# Patient Record
Sex: Female | Born: 1950 | Race: White | Marital: Married | Smoking: Never smoker
Health system: Northeastern US, Academic
[De-identification: ages and names within clinical notes are randomized; demographics above are authoritative.]

## PROBLEM LIST (undated history)

## (undated) DIAGNOSIS — I10 Essential (primary) hypertension: Secondary | ICD-10-CM

## (undated) DIAGNOSIS — J45909 Unspecified asthma, uncomplicated: Secondary | ICD-10-CM

## (undated) DIAGNOSIS — E876 Hypokalemia: Secondary | ICD-10-CM

## (undated) DIAGNOSIS — E039 Hypothyroidism, unspecified: Secondary | ICD-10-CM

## (undated) DIAGNOSIS — E7800 Pure hypercholesterolemia, unspecified: Secondary | ICD-10-CM

## (undated) DIAGNOSIS — J449 Chronic obstructive pulmonary disease, unspecified: Secondary | ICD-10-CM

## (undated) HISTORY — DX: Hypothyroidism, unspecified: E03.9

## (undated) HISTORY — DX: Unspecified asthma, uncomplicated: J45.909

## (undated) HISTORY — DX: Pure hypercholesterolemia, unspecified: E78.00

## (undated) HISTORY — DX: Essential (primary) hypertension: I10

## (undated) HISTORY — PX: TUBAL LIGATION: SHX77

## (undated) HISTORY — DX: Chronic obstructive pulmonary disease, unspecified: J44.9

## (undated) HISTORY — PX: TONSILLECTOMY: SUR1361

---

## 1998-01-21 ENCOUNTER — Encounter: Payer: Self-pay | Admitting: Obstetrics and Gynecology

## 1998-01-21 ENCOUNTER — Ambulatory Visit (HOSPITAL_COMMUNITY): Admission: RE | Admit: 1998-01-21 | Discharge: 1998-01-21 | Payer: Self-pay | Admitting: Obstetrics and Gynecology

## 1998-08-21 ENCOUNTER — Emergency Department (HOSPITAL_COMMUNITY): Admission: EM | Admit: 1998-08-21 | Discharge: 1998-08-21 | Payer: Self-pay | Admitting: Emergency Medicine

## 1998-08-22 ENCOUNTER — Encounter: Payer: Self-pay | Admitting: Emergency Medicine

## 1999-06-17 ENCOUNTER — Other Ambulatory Visit: Admission: RE | Admit: 1999-06-17 | Discharge: 1999-06-17 | Payer: Self-pay | Admitting: Emergency Medicine

## 1999-06-27 ENCOUNTER — Ambulatory Visit (HOSPITAL_COMMUNITY): Admission: RE | Admit: 1999-06-27 | Discharge: 1999-06-27 | Payer: Self-pay | Admitting: Emergency Medicine

## 1999-06-27 ENCOUNTER — Encounter: Payer: Self-pay | Admitting: Emergency Medicine

## 1999-08-14 ENCOUNTER — Emergency Department (HOSPITAL_COMMUNITY): Admission: EM | Admit: 1999-08-14 | Discharge: 1999-08-14 | Payer: Self-pay | Admitting: Emergency Medicine

## 1999-08-14 ENCOUNTER — Encounter: Payer: Self-pay | Admitting: Emergency Medicine

## 1999-09-02 ENCOUNTER — Other Ambulatory Visit: Admission: RE | Admit: 1999-09-02 | Discharge: 1999-09-02 | Payer: Self-pay | Admitting: *Deleted

## 1999-10-27 ENCOUNTER — Emergency Department (HOSPITAL_COMMUNITY): Admission: EM | Admit: 1999-10-27 | Discharge: 1999-10-27 | Payer: Self-pay | Admitting: Emergency Medicine

## 1999-11-04 ENCOUNTER — Other Ambulatory Visit: Admission: RE | Admit: 1999-11-04 | Discharge: 1999-11-04 | Payer: Self-pay | Admitting: *Deleted

## 1999-11-05 ENCOUNTER — Other Ambulatory Visit: Admission: RE | Admit: 1999-11-05 | Discharge: 1999-11-05 | Payer: Self-pay | Admitting: *Deleted

## 1999-11-05 ENCOUNTER — Encounter (INDEPENDENT_AMBULATORY_CARE_PROVIDER_SITE_OTHER): Payer: Self-pay | Admitting: Specialist

## 2000-02-12 ENCOUNTER — Other Ambulatory Visit: Admission: RE | Admit: 2000-02-12 | Discharge: 2000-02-12 | Payer: Self-pay | Admitting: *Deleted

## 2000-05-05 ENCOUNTER — Encounter: Admission: RE | Admit: 2000-05-05 | Discharge: 2000-05-05 | Payer: Self-pay | Admitting: *Deleted

## 2000-05-05 ENCOUNTER — Encounter: Payer: Self-pay | Admitting: *Deleted

## 2000-06-30 ENCOUNTER — Other Ambulatory Visit: Admission: RE | Admit: 2000-06-30 | Discharge: 2000-06-30 | Payer: Self-pay | Admitting: *Deleted

## 2000-10-06 ENCOUNTER — Encounter: Admission: RE | Admit: 2000-10-06 | Discharge: 2000-10-06 | Payer: Self-pay | Admitting: Otolaryngology

## 2000-10-06 ENCOUNTER — Encounter: Payer: Self-pay | Admitting: Otolaryngology

## 2000-12-26 ENCOUNTER — Encounter: Payer: Self-pay | Admitting: Emergency Medicine

## 2000-12-26 ENCOUNTER — Emergency Department (HOSPITAL_COMMUNITY): Admission: EM | Admit: 2000-12-26 | Discharge: 2000-12-26 | Payer: Self-pay | Admitting: Emergency Medicine

## 2001-08-27 ENCOUNTER — Encounter: Payer: Self-pay | Admitting: Emergency Medicine

## 2001-08-27 ENCOUNTER — Emergency Department (HOSPITAL_COMMUNITY): Admission: EM | Admit: 2001-08-27 | Discharge: 2001-08-27 | Payer: Self-pay | Admitting: Emergency Medicine

## 2001-08-28 ENCOUNTER — Encounter: Admission: RE | Admit: 2001-08-28 | Discharge: 2001-08-28 | Payer: Self-pay

## 2001-09-26 ENCOUNTER — Other Ambulatory Visit: Admission: RE | Admit: 2001-09-26 | Discharge: 2001-09-26 | Payer: Self-pay | Admitting: Obstetrics and Gynecology

## 2001-10-10 ENCOUNTER — Encounter: Payer: Self-pay | Admitting: *Deleted

## 2001-10-10 ENCOUNTER — Ambulatory Visit (HOSPITAL_COMMUNITY): Admission: RE | Admit: 2001-10-10 | Discharge: 2001-10-10 | Payer: Self-pay | Admitting: *Deleted

## 2001-12-08 ENCOUNTER — Encounter (HOSPITAL_COMMUNITY): Admission: RE | Admit: 2001-12-08 | Discharge: 2002-03-08 | Payer: Self-pay | Admitting: Neurosurgery

## 2003-01-07 ENCOUNTER — Encounter: Payer: Self-pay | Admitting: Emergency Medicine

## 2003-01-07 ENCOUNTER — Emergency Department (HOSPITAL_COMMUNITY): Admission: EM | Admit: 2003-01-07 | Discharge: 2003-01-07 | Payer: Self-pay | Admitting: Emergency Medicine

## 2003-04-19 ENCOUNTER — Emergency Department (HOSPITAL_COMMUNITY): Admission: EM | Admit: 2003-04-19 | Discharge: 2003-04-19 | Payer: Self-pay | Admitting: Emergency Medicine

## 2003-05-08 ENCOUNTER — Ambulatory Visit (HOSPITAL_COMMUNITY): Admission: RE | Admit: 2003-05-08 | Discharge: 2003-05-08 | Payer: Self-pay

## 2004-04-25 ENCOUNTER — Ambulatory Visit: Payer: Self-pay | Admitting: Cardiology

## 2004-04-25 ENCOUNTER — Encounter: Payer: Self-pay | Admitting: Cardiology

## 2004-04-25 ENCOUNTER — Ambulatory Visit (HOSPITAL_COMMUNITY): Admission: RE | Admit: 2004-04-25 | Discharge: 2004-04-25 | Payer: Self-pay | Admitting: Family Medicine

## 2004-04-27 ENCOUNTER — Emergency Department (HOSPITAL_COMMUNITY): Admission: EM | Admit: 2004-04-27 | Discharge: 2004-04-27 | Payer: Self-pay | Admitting: Emergency Medicine

## 2004-05-23 ENCOUNTER — Ambulatory Visit (HOSPITAL_COMMUNITY): Admission: RE | Admit: 2004-05-23 | Discharge: 2004-05-23 | Payer: Self-pay | Admitting: Family Medicine

## 2006-11-09 ENCOUNTER — Ambulatory Visit: Payer: Self-pay | Admitting: Pulmonary Disease

## 2006-11-09 LAB — CONVERTED CEMR LAB
ALT: 27 units/L (ref 0–35)
Basophils Relative: 0.9 % (ref 0.0–1.0)
Bilirubin, Direct: 0.1 mg/dL (ref 0.0–0.3)
CO2: 30 meq/L (ref 19–32)
Creatinine, Ser: 0.6 mg/dL (ref 0.4–1.2)
Eosinophils Relative: 1.3 % (ref 0.0–5.0)
GFR calc Af Amer: 134 mL/min
Glucose, Bld: 93 mg/dL (ref 70–99)
HCT: 38.9 % (ref 36.0–46.0)
Hemoglobin: 13.9 g/dL (ref 12.0–15.0)
Lymphocytes Relative: 28.8 % (ref 12.0–46.0)
Monocytes Absolute: 0.5 10*3/uL (ref 0.2–0.7)
Neutro Abs: 4.9 10*3/uL (ref 1.4–7.7)
Potassium: 3.4 meq/L — ABNORMAL LOW (ref 3.5–5.1)
Total Bilirubin: 0.6 mg/dL (ref 0.3–1.2)
Total Protein: 6.9 g/dL (ref 6.0–8.3)
WBC: 7.9 10*3/uL (ref 4.5–10.5)

## 2006-11-10 ENCOUNTER — Encounter: Payer: Self-pay | Admitting: Pulmonary Disease

## 2006-11-10 LAB — CONVERTED CEMR LAB: IgE (Immunoglobulin E), Serum: 20 intl units/mL (ref 0.0–180.0)

## 2008-07-06 ENCOUNTER — Emergency Department (HOSPITAL_COMMUNITY): Admission: EM | Admit: 2008-07-06 | Discharge: 2008-07-06 | Payer: Self-pay | Admitting: Internal Medicine

## 2009-08-03 ENCOUNTER — Encounter (INDEPENDENT_AMBULATORY_CARE_PROVIDER_SITE_OTHER): Payer: Self-pay | Admitting: Internal Medicine

## 2009-08-03 ENCOUNTER — Observation Stay (HOSPITAL_COMMUNITY): Admission: EM | Admit: 2009-08-03 | Discharge: 2009-08-04 | Payer: Self-pay | Admitting: Emergency Medicine

## 2009-08-03 ENCOUNTER — Ambulatory Visit: Payer: Self-pay | Admitting: Cardiology

## 2010-06-17 LAB — D-DIMER, QUANTITATIVE: D-Dimer, Quant: 0.38 ug/mL-FEU (ref 0.00–0.48)

## 2010-06-17 LAB — URINALYSIS, ROUTINE W REFLEX MICROSCOPIC
Glucose, UA: NEGATIVE mg/dL
Ketones, ur: NEGATIVE mg/dL
Leukocytes, UA: NEGATIVE
Nitrite: NEGATIVE
pH: 6.5 (ref 5.0–8.0)

## 2010-06-17 LAB — BASIC METABOLIC PANEL
CO2: 27 mEq/L (ref 19–32)
Chloride: 102 mEq/L (ref 96–112)
GFR calc Af Amer: >60 mL/min (ref >60)
Glucose, Bld: 99 mg/dL (ref 70–99)
Sodium: 139 mEq/L (ref 135–145)

## 2010-06-17 LAB — CK TOTAL AND CKMB (NOT AT ARMC): Relative Index: INVALID (ref 0.0–2.5)

## 2010-06-17 LAB — CBC
HCT: 41.6 % (ref 36.0–46.0)
MCV: 90.2 fL (ref 78.0–100.0)
Platelets: 262 10*3/uL (ref 150–400)
Platelets: 279 10*3/uL (ref 150–400)
RDW: 13.5 % (ref 11.5–15.5)
WBC: 12.3 10*3/uL — ABNORMAL HIGH (ref 4.0–10.5)
WBC: 14.8 10*3/uL — ABNORMAL HIGH (ref 4.0–10.5)

## 2010-06-17 LAB — CULTURE, BLOOD (ROUTINE X 2)

## 2010-06-17 LAB — LIPID PANEL
Cholesterol: 221 mg/dL — ABNORMAL HIGH (ref 0–200)
HDL: 54 mg/dL (ref >39)
LDL Cholesterol: 145 mg/dL — ABNORMAL HIGH (ref 0–99)
Triglycerides: 112 mg/dL (ref <150)

## 2010-06-17 LAB — DIFFERENTIAL
Basophils Absolute: 0.1 10*3/uL (ref 0.0–0.1)
Basophils Relative: 1 % (ref 0–1)
Eosinophils Absolute: 0 10*3/uL (ref 0.0–0.7)
Eosinophils Relative: 0 % (ref 0–5)
Lymphs Abs: 2 10*3/uL (ref 0.7–4.0)
Neutrophils Relative %: 82 % — ABNORMAL HIGH (ref 43–77)

## 2010-06-17 LAB — URINE MICROSCOPIC-ADD ON

## 2010-06-17 LAB — CARDIAC PANEL(CRET KIN+CKTOT+MB+TROPI)
CK, MB: 1.8 ng/mL (ref 0.3–4.0)
Relative Index: INVALID (ref 0.0–2.5)
Troponin I: 0.03 ng/mL (ref 0.00–0.06)
Troponin I: 0.05 ng/mL (ref 0.00–0.06)

## 2010-06-17 LAB — POCT CARDIAC MARKERS: Myoglobin, poc: 42.5 ng/mL (ref 12–200)

## 2010-06-17 LAB — POCT I-STAT, CHEM 8
BUN: 19 mg/dL (ref 6–23)
Calcium, Ion: 1.13 mmol/L (ref 1.12–1.32)
Glucose, Bld: 153 mg/dL — ABNORMAL HIGH (ref 70–99)
TCO2: 28 mmol/L (ref 0–100)

## 2010-06-17 LAB — PHOSPHORUS: Phosphorus: 3.9 mg/dL (ref 2.3–4.6)

## 2010-06-17 LAB — TSH: TSH: 2.549 u[IU]/mL (ref 0.350–4.500)

## 2010-06-17 LAB — TROPONIN I: Troponin I: 0.03 ng/mL (ref 0.00–0.06)

## 2010-07-09 LAB — POCT I-STAT, CHEM 8
Chloride: 102 mEq/L (ref 96–112)
HCT: 43 % (ref 36.0–46.0)
Potassium: 3.4 mEq/L — ABNORMAL LOW (ref 3.5–5.1)

## 2010-07-09 LAB — POCT CARDIAC MARKERS
CKMB, poc: <1 ng/mL — ABNORMAL LOW (ref 1.0–8.0)
Myoglobin, poc: 52.3 ng/mL (ref 12–200)
Troponin i, poc: <0.05 ng/mL (ref 0.00–0.09)

## 2010-07-09 LAB — D-DIMER, QUANTITATIVE: D-Dimer, Quant: 0.38 ug/mL-FEU (ref 0.00–0.48)

## 2010-08-12 NOTE — Assessment & Plan Note (Signed)
[facility]                             PULMONARY OFFICE NOTE   Sabrina Collins, Sabrina Collins                      MRN:          [id number]  DATE:11/09/2006                            DOB:          Feb 10, 1951    I met Sabrina Collins today for evaluation of her dyspnea.   She carries a diagnosis of asthma.  She says that her symptoms started  approximately 4 years ago when she developed a bronchitis and pneumonia,  and has gotten progressively worse over the last 1 year.  She has had  several episodes in which she needed to have a course of prednisone, as  well as antibiotics.  She says that she frequently will get episodes of  wheezing associated with a cough, which is usually dry.  She denies  having hemoptysis.  She does have problems with sneezing, itchy, and  watery eyes.  She says that summer time seems to be the worst time of  the year for her.  She does have occasional post-nasal drip, as well as  a globus sensation.  She is not having problems with exposures to dogs  and cats.  She does not have any problems when she is around grass and  pollen.  She does have worsening of her symptoms with strong smells.  She does develop hives when she is exposed to penicillin.  She had  worked in a [employer] for 2 years approximately 20 years ago.  She  says that she had a course of prednisone and azithromycin about a week  or 2 ago, which had helped with her symptoms, and she currently uses  Advair 250/50 as well as Combivent on a regular basis.  She states she  had a chest x-ray approximately 6 months ago and PFTs approximately 1  year ago, but I do not have those available for my review at this time.  She says that she also does have quite a bit of difficulty with her  sleep.  Her daughter, who is present with her today, says that she  snores quite loudly, but then gets very quiet and abruptly will start  making a loud snoring noise again.  Sabrina Collins also  complains of  feeling sleepy during the day.  She says she has gained approximately 50  pounds over the last 2 years and, associated with this, has been  worsening in her breathing symptoms, as well as her sleep patterns.  She  has not had any recent travel history.  She denies any significant sick  exposures.   PAST MEDICAL HISTORY:  Significant for a DVT 21 years ago with one of  her pregnancies.  As far as she knows, there is no history of pulmonary  embolism.  She has hypertension, asthma, allergies, and vertigo.   PAST SURGICAL HISTORY:  Significant for a tubal ligation 21 years ago.   CURRENT MEDICATIONS:  1. Amlodipine 5 mg daily.  2. Benicar hydrochlorothiazide 40/12.5 once daily.  3. Prevacid 30 mg daily.  4. Combivent 3 to 4 puffs daily.  5. Advair 250/50 one puff b.i.d.  6. Meloxicam 50 mg daily.  7. Vitamin E.  8. ProAir HFA as needed.  9. Meclizine 25 mg as needed.  10.Tramadol 50 mg as needed.   SOCIAL HISTORY:  She is married.  There is no history of tobacco or  alcohol use.  She has no second hand exposure to tobacco.  She is  currently a housewife.   FAMILY HISTORY:  Significant for her daughter who has asthma and  allergies.  Her mother had lung cancer and her father had colon cancer.   REVIEW OF SYSTEMS:  She does complain of reflux, as well as vertigo.   PHYSICAL EXAM:  She is 5 feet 1 inches tall and 229 pounds.  Temperature  is 97.8, blood pressure 136/80, heart rate 78, oxygen saturation 90% on  room air.  HEENT:  Pupils are reactive.  There is no sinus tenderness.  There is  clear nasal discharge.  There is mild erythema of the posterior  oropharynx.  She has a Mallampati IV airway.  There is no  lymphadenopathy.  No thyromegaly.  HEART:  S1, S2.  CHEST:  There is no wheezing or rales.  ABDOMEN:  Obese, soft, and nontender.  EXTREMITIES:  No cyanosis, clubbing, or edema.  NEUROLOGIC:  No focal deficits were appreciated.   IMPRESSION:  1. Dyspnea  with symptoms suggestive of allergy, possibly with an      allergic component.  What I would like for her to do is undergo      RAST test and to check her IgE level.  I will also have her undergo      a repeat chest x-ray and try to obtain a copy of her pulmonary      function test from last year.  In the meantime, I will switch her      to Symbicort 160/4.5 two puffs b.i.d., and I have instructed her on      the use of inhalers and advised her to rinse her mouth out      afterwards.  She is also to continue on the use of her Prevacid and      I have instructed her on the use of nasal irrigation.  She is also      to continue on the use of her Combivent as needed.  I have advised      her that, if she is using Combivent, she does not need to use her      ProAir HFA.  Further recommendations will be made following the      review of the above test results.  2. Sleep disturbance with excessive daytime sleepiness in the setting      of hypertension.  I am concerned that she may have some degree of      sleep apnea.  I have discussed with her the importance of diet,      exercise, and weight loss.  Additionally, I will arrange for her to      undergo an overnight polysomnogram to further evaluate the      possibility of sleep-disordered breathing.   I will follow up with her in approximately 3 to 4 weeks.     Coralyn Helling, MD  Electronically Signed    VS/MedQ  DD: 11/10/2006  DT: 11/11/2006  Job #: [id number]   cc:   Tamera Punt, PA

## 2016-02-05 ENCOUNTER — Observation Stay (HOSPITAL_BASED_OUTPATIENT_CLINIC_OR_DEPARTMENT_OTHER): Payer: BLUE CROSS/BLUE SHIELD

## 2016-02-05 ENCOUNTER — Encounter (HOSPITAL_COMMUNITY): Payer: Self-pay | Admitting: Emergency Medicine

## 2016-02-05 ENCOUNTER — Observation Stay (HOSPITAL_COMMUNITY): Payer: BLUE CROSS/BLUE SHIELD

## 2016-02-05 ENCOUNTER — Observation Stay (HOSPITAL_COMMUNITY)
Admission: EM | Admit: 2016-02-05 | Discharge: 2016-02-06 | Disposition: A | Discharge status: Home / Self Care | Payer: BLUE CROSS/BLUE SHIELD | Attending: Internal Medicine | Admitting: Internal Medicine

## 2016-02-05 ENCOUNTER — Emergency Department (HOSPITAL_COMMUNITY): Payer: BLUE CROSS/BLUE SHIELD

## 2016-02-05 DIAGNOSIS — R079 Chest pain, unspecified: Secondary | ICD-10-CM

## 2016-02-05 DIAGNOSIS — E6609 Other obesity due to excess calories: Secondary | ICD-10-CM | POA: Diagnosis not present

## 2016-02-05 DIAGNOSIS — E669 Obesity, unspecified: Secondary | ICD-10-CM | POA: Diagnosis present

## 2016-02-05 DIAGNOSIS — Z79899 Other long term (current) drug therapy: Secondary | ICD-10-CM | POA: Diagnosis not present

## 2016-02-05 DIAGNOSIS — I10 Essential (primary) hypertension: Secondary | ICD-10-CM | POA: Diagnosis not present

## 2016-02-05 DIAGNOSIS — R52 Pain, unspecified: Secondary | ICD-10-CM

## 2016-02-05 DIAGNOSIS — R0789 Other chest pain: Principal | ICD-10-CM | POA: Insufficient documentation

## 2016-02-05 DIAGNOSIS — K219 Gastro-esophageal reflux disease without esophagitis: Secondary | ICD-10-CM

## 2016-02-05 HISTORY — DX: Essential (primary) hypertension: I10

## 2016-02-05 LAB — NM MYOCAR MULTI W/SPECT W/WALL MOTION / EF
Estimated workload: 1 METS
Exercise duration (min): 0 min
Exercise duration (sec): 0 s
LV dias vol: 55 mL (ref 46–106)
LV sys vol: 14 mL
MPHR: 158 {beats}/min
Peak HR: 104 {beats}/min
Percent HR: 65 %
RATE: 0
Rest HR: 71 {beats}/min
SDS: 0
SRS: 11
SSS: 11
TID: 1.67

## 2016-02-05 LAB — BASIC METABOLIC PANEL
Anion gap: 8 (ref 5–15)
BUN: 16 mg/dL (ref 6–20)
CHLORIDE: 105 mmol/L (ref 101–111)
CO2: 26 mmol/L (ref 22–32)
CREATININE: 0.89 mg/dL (ref 0.44–1.00)
Calcium: 9.5 mg/dL (ref 8.9–10.3)
GFR calc Af Amer: >60 mL/min (ref >60)
GFR calc non Af Amer: >60 mL/min (ref >60)
Glucose, Bld: 105 mg/dL — ABNORMAL HIGH (ref 65–99)
POTASSIUM: 4.1 mmol/L (ref 3.5–5.1)
SODIUM: 139 mmol/L (ref 135–145)

## 2016-02-05 LAB — CBC
HCT: 40.2 % (ref 36.0–46.0)
Hemoglobin: 13.5 g/dL (ref 12.0–15.0)
MCH: 30.9 pg (ref 26.0–34.0)
MCHC: 33.6 g/dL (ref 30.0–36.0)
MCV: 92 fL (ref 78.0–100.0)
Platelets: 271 K/uL (ref 150–400)
RBC: 4.37 MIL/uL (ref 3.87–5.11)
RDW: 13.3 % (ref 11.5–15.5)
WBC: 9.3 K/uL (ref 4.0–10.5)

## 2016-02-05 LAB — TROPONIN I

## 2016-02-05 LAB — HEPATIC FUNCTION PANEL
ALK PHOS: 53 U/L (ref 38–126)
ALT: 18 U/L (ref 14–54)
AST: 25 U/L (ref 15–41)
Albumin: 4 g/dL (ref 3.5–5.0)
BILIRUBIN INDIRECT: 0.5 mg/dL (ref 0.3–0.9)
BILIRUBIN TOTAL: 0.8 mg/dL (ref 0.3–1.2)
Bilirubin, Direct: 0.3 mg/dL (ref 0.1–0.5)
Total Protein: 6.7 g/dL (ref 6.5–8.1)

## 2016-02-05 LAB — LIPASE, BLOOD: Lipase: 37 U/L (ref 11–51)

## 2016-02-05 LAB — I-STAT TROPONIN, ED: Troponin i, poc: 0 ng/mL (ref 0.00–0.08)

## 2016-02-05 MED ORDER — LOSARTAN POTASSIUM 50 MG PO TABS
50.0000 mg | ORAL_TABLET | Freq: Every day | ORAL | Status: DC
  Administered 2016-02-05 – 2016-02-06 (×2): 50 mg via ORAL
  Filled 2016-02-05 (×2): qty 1

## 2016-02-05 MED ORDER — ONDANSETRON HCL 4 MG/2ML IJ SOLN
4.0000 mg | Freq: Four times a day (QID) | INTRAMUSCULAR | Status: DC | PRN
  Administered 2016-02-05: 4 mg via INTRAVENOUS
  Filled 2016-02-05: qty 2

## 2016-02-05 MED ORDER — TECHNETIUM TC 99M TETROFOSMIN IV KIT
30.0000 | PACK | Freq: Once | INTRAVENOUS | Status: AC | PRN
  Administered 2016-02-05: 30 via INTRAVENOUS

## 2016-02-05 MED ORDER — ENOXAPARIN SODIUM 40 MG/0.4ML ~~LOC~~ SOLN
40.0000 mg | SUBCUTANEOUS | Status: DC
  Administered 2016-02-06: 40 mg via SUBCUTANEOUS
  Filled 2016-02-05: qty 0.4

## 2016-02-05 MED ORDER — GI COCKTAIL ~~LOC~~
30.0000 mL | Freq: Four times a day (QID) | ORAL | Status: DC | PRN
  Administered 2016-02-05: 30 mL via ORAL
  Filled 2016-02-05: qty 30

## 2016-02-05 MED ORDER — REGADENOSON 0.4 MG/5ML IV SOLN
INTRAVENOUS | Status: AC
  Administered 2016-02-05: 0.4 mg via INTRAVENOUS
  Filled 2016-02-05: qty 5

## 2016-02-05 MED ORDER — ACETAMINOPHEN 325 MG PO TABS
650.0000 mg | ORAL_TABLET | ORAL | Status: DC | PRN
  Administered 2016-02-05 (×3): 650 mg via ORAL
  Filled 2016-02-05 (×3): qty 2

## 2016-02-05 MED ORDER — NITROGLYCERIN 0.4 MG SL SUBL
0.4000 mg | SUBLINGUAL_TABLET | SUBLINGUAL | Status: DC | PRN
  Administered 2016-02-05 (×2): 0.4 mg via SUBLINGUAL
  Filled 2016-02-05: qty 1

## 2016-02-05 MED ORDER — FAMOTIDINE 20 MG PO TABS
20.0000 mg | ORAL_TABLET | Freq: Two times a day (BID) | ORAL | Status: DC
  Administered 2016-02-05 – 2016-02-06 (×3): 20 mg via ORAL
  Filled 2016-02-05 (×3): qty 1

## 2016-02-05 MED ORDER — FUROSEMIDE 40 MG PO TABS
40.0000 mg | ORAL_TABLET | Freq: Every day | ORAL | Status: DC
  Administered 2016-02-05 – 2016-02-06 (×2): 40 mg via ORAL
  Filled 2016-02-05 (×2): qty 1

## 2016-02-05 MED ORDER — MORPHINE SULFATE (PF) 2 MG/ML IV SOLN
2.0000 mg | Freq: Once | INTRAVENOUS | Status: AC
  Administered 2016-02-05: 2 mg via INTRAVENOUS
  Filled 2016-02-05: qty 1

## 2016-02-05 MED ORDER — SPIRONOLACTONE 25 MG PO TABS
25.0000 mg | ORAL_TABLET | Freq: Every day | ORAL | Status: DC
  Administered 2016-02-05 – 2016-02-06 (×2): 25 mg via ORAL
  Filled 2016-02-05 (×2): qty 1

## 2016-02-05 MED ORDER — SUCRALFATE 1 G PO TABS
1.0000 g | ORAL_TABLET | Freq: Three times a day (TID) | ORAL | Status: DC
  Administered 2016-02-05 – 2016-02-06 (×3): 1 g via ORAL
  Filled 2016-02-05 (×3): qty 1

## 2016-02-05 MED ORDER — ASPIRIN 81 MG PO CHEW
324.0000 mg | CHEWABLE_TABLET | Freq: Once | ORAL | Status: AC
  Administered 2016-02-05: 324 mg via ORAL
  Filled 2016-02-05: qty 4

## 2016-02-05 MED ORDER — REGADENOSON 0.4 MG/5ML IV SOLN
0.4000 mg | Freq: Once | INTRAVENOUS | Status: AC
  Administered 2016-02-05: 0.4 mg via INTRAVENOUS
  Filled 2016-02-05: qty 5

## 2016-02-05 MED ORDER — TECHNETIUM TC 99M TETROFOSMIN IV KIT
10.0000 | PACK | Freq: Once | INTRAVENOUS | Status: AC | PRN
  Administered 2016-02-05: 10 via INTRAVENOUS

## 2016-02-05 MED ORDER — DICLOFENAC SODIUM 75 MG PO TBEC
75.0000 mg | DELAYED_RELEASE_TABLET | Freq: Two times a day (BID) | ORAL | Status: DC
  Administered 2016-02-05 – 2016-02-06 (×3): 75 mg via ORAL
  Filled 2016-02-05 (×3): qty 1

## 2016-02-05 NOTE — Progress Notes (Signed)
Patient admitted after midnight, await stress test.  Will get RUQ U/s as well since patient NPO-- no LFT elevation but nausea and vomiting, unable to keep anything down.  Added pepcid  Eulogio Bear DO

## 2016-02-05 NOTE — ED Triage Notes (Signed)
Patient reports mid chest/epigastric pain onset yesterday with nausea , denies emesis or diaphoresis , pain radiating to mid back . Denies SOB or fever .

## 2016-02-05 NOTE — H&P (Signed)
History and Physical  Patient Name: Sabrina Collins     [id number]    DOB: Feb 02, 1951    DOA: 02/05/2016 PCP: Imelda Pillow, NP   Patient coming from: Home     Chief Complaint: Chest pain  HPI: Sabrina Collins is a 65 y.o. female with a past medical history significant for HTN and obesity who presents with chest pain.  The patient was in her usual state of health until several days ago, she spent too long in a hot tub with friends, and since then she has felt "off" and tired and has foregone her usual exercise routine (treadmill, etc.).    Today, around 11A, she noticed onset of vague substernal/epigastric discomfort, mild in intensity, constant, not worse with anything in particular.  This persisted until tonight around 9 PM, a few hours after supper (meatloaf, vegetables), she had worsening of her chest pain was severe, constant epigastric pain radiating to her back, worse with laying down, associated with nausea but no vomiting. This lasted for several hours until she got her husband up from bed and had him bring her to the hospital.  ED course: -Afebrile, heart rate 60s, respirations and pulse oximetry normal, blood pressure 140/80 -Initial ECG showed normal sinus rhythm, left axis and troponin was negative. -Na 139, K 4.1, Cr 0.89, WBC 9.3, Hgb 13.5 -She was given nitroglycerin, which improved her pain -She was given full aspirin and TRH were asked to evaluate for admission  The patient has a past medical history significant for HTN, obesity. She is no history of diabetes, no prior cardiac disease, no family history of premature cardiovascular disease, no smoking. She has had heartburn in the past, but no pancreatitis.  She had a nuclear perfusion study in 2011 that was normal, that was performed after she presented with chest discomfort and abnormal ECG to her PCP.     Review of Systems:  Review of Systems  Respiratory: Negative for shortness of breath.     Cardiovascular: Positive for chest pain.  Gastrointestinal: Positive for nausea.  All other systems reviewed and are negative.    Past Medical History:  Diagnosis Date  . Hypertension     History reviewed. No pertinent surgical history.  Social History: Patient lives with her husband.  Patient walks unassisted.  She does not smoker.    Allergies  Allergen Reactions  . Codeine Rash  . Penicillins Rash    Family history: family history includes Asthma in her daughter; Colon cancer in her father; Lung cancer in her mother; Ulcers in her father.  Prior to Admission medications   Medication Sig Start Date End Date Taking? Authorizing Provider  carvedilol (COREG) 6.25 MG tablet Take 6.25 mg by mouth 2 (two) times daily with a meal.   Yes Historical Provider, MD  diclofenac (VOLTAREN) 75 MG EC tablet Take 75 mg by mouth 2 (two) times daily.   Yes Historical Provider, MD  furosemide (LASIX) 40 MG tablet Take 40 mg by mouth daily.   Yes Historical Provider, MD  losartan (COZAAR) 50 MG tablet Take 50 mg by mouth daily.   Yes Historical Provider, MD  spironolactone (ALDACTONE) 25 MG tablet Take 25 mg by mouth daily.   Yes Historical Provider, MD  vitamin E 400 UNIT capsule Take 400 Units by mouth daily.   Yes Historical Provider, MD       Physical Exam: BP 145/72   Pulse (!) 58   Temp 98.3 F (36.8 C) (Oral)  Resp 16   SpO2 100%  General appearance: Well-developed, adult female, alert and in no acute distress.   Eyes: Anicteric, conjunctiva pink, lids and lashes normal.     ENT: No nasal deformity, discharge, or epistaxis.  OP moist without lesions.   Skin: Warm and dry.   Cardiac: RRR, nl S1-S2, no murmurs appreciated.  Capillary refill is brisk.  JVP normal.  No LE edema.  Radial and DP pulses 2+ and symmetric.  No carotid bruits. Respiratory: Normal respiratory rate and rhythm.  CTAB without rales or wheezes. GI: Abdomen soft without rigidity.  Mild epigastric TTP. No  ascites, distension.   MSK: No deformities or effusions.   Pain not reproduced with palpation of precordium.  No pain with arm movement. Neuro: Sensorium intact and responding to questions, attention normal.  Speech is fluent.  Moves all extremities equally and with normal coordination.    Psych: Behavior appropriate.  Affect normal.  No evidence of aural or visual hallucinations or delusions.       Labs on Admission:  The metabolic panel shows normal electrolytes and renal function. The complete blood count shows no anemia, leukocytosis, thrombocytopenia. The initial troponin is negative.  Radiological Exams on Admission: Personally reviewed chest x-ray shows no focal opacity: Dg Chest 2 View  Result Date: 02/05/2016 CLINICAL DATA:  Mid to lower chest pain since this evening EXAM: CHEST  2 VIEW COMPARISON:  08/03/2009 FINDINGS: The heart size and mediastinal contours are within normal limits. Both lungs are clear. The visualized skeletal structures are unremarkable. IMPRESSION: No active cardiopulmonary disease. Electronically Signed   By: Ashley Royalty M.D.   On: 02/05/2016 01:24    EKG: Independently reviewed. Rate 69, QTC 413, left axis, no ST or T-wave changes.    Assessment/Plan 1. Chest pain: This is new.  The patient has HEART score of 4. Her pain has typical and atypical features in that it is substernal, dull, and was relieved with nitroglycerin, but is not particularly exertional.  Other potential causes of chest pain (PE, dissection, pneumonia/effusion, pericarditis) are doubted.  We will rule out pancreatitis. We have been asked to admit the patient for observation and etiology consultation with Cardiology tomorrow.  -Serial troponins are ordered -Telemetry -Consult to cardiology, appreciate recommendations -Obtain LFTs, lipase    2. HTN:  -Continue spironolactone, furosemide, losartan -Hold carvedilol in case of stress testing  3. Arthritis:  -Continue  diclofenac             DVT prophylaxis: Lovenox Diet: NPO after 4am for anticipated stress testing Code Status: FULL  Family Communication: Husband at bedside  Disposition Plan: Anticipate overnight observation for arrhythmia on telemetry, serial troponins and subsequent risk stratification by Cardiology.  If testing negative, home after. Consults called: Cardiology, notified via Inbasket Admission status: Telemetry, OBS   Medical decision making: Patient seen at 4:10 AM on 02/05/2016.  The patient was discussed with Dr. Christy Gentles. What exists of the patient's chart was reviewed in depth.  Clinical condition: stable.      Edwin Dada [facility] Pager [phone]

## 2016-02-05 NOTE — Consult Note (Signed)
Reason for Consult:   Chest pain  Requesting Physician: Dr Eliseo Squires Primary Cardiologist Dr Domenic Polite (last seen 2011)  HPI:   65 y/o overweight Caucasian female, with a history of HTN, seen by cardiology in 2011 (negative nuclear stress then), admitted with SSCP and we are asked to consult. The pt describes mid sternal chest pain with associated nausea and radiation to her back (between her shoulders). She denies any radiation to her jaw or arms. Her symptoms started 24-48 hrs ago and became worse last 24 hours. Her Troponin are negative and her EKG shows no acute changes.   PMHx:  Past Medical History:  Diagnosis Date  . Hypertension     History reviewed. No pertinent surgical history.  SOCHx:  reports that she has never smoked. She has never used smokeless tobacco. She reports that she does not drink alcohol or use drugs.  FAMHx: Family History  Problem Relation Age of Onset  . Lung cancer Mother   . Colon cancer Father   . Ulcers Father   . Asthma Daughter     ALLERGIES: Allergies  Allergen Reactions  . Codeine Rash  . Penicillins Rash    ROS: Review of Systems: General: negative for chills, fever, night sweats or weight changes.  Cardiovascular: negative for chest pain, dyspnea on exertion, edema, orthopnea, palpitations, paroxysmal nocturnal dyspnea or shortness of breath HEENT: negative for any visual disturbances, blindness, glaucoma Dermatological: negative for rash Respiratory: negative for cough, hemoptysis, or wheezing Urologic: negative for hematuria or dysuria Abdominal: negative for nausea, vomiting, diarrhea, bright red blood per rectum, melena, or hematemesis Neurologic: negative for visual changes, syncope, or dizziness Musculoskeletal: negative for back pain, joint pain, or swelling Psych: cooperative and appropriate All other systems reviewed and are otherwise negative except as noted above.   HOME MEDICATIONS: Prior to Admission  medications   Medication Sig Start Date End Date Taking? Authorizing Provider  carvedilol (COREG) 6.25 MG tablet Take 6.25 mg by mouth 2 (two) times daily with a meal.   Yes Historical Provider, MD  diclofenac (VOLTAREN) 75 MG EC tablet Take 75 mg by mouth 2 (two) times daily.   Yes Historical Provider, MD  furosemide (LASIX) 40 MG tablet Take 40 mg by mouth daily.   Yes Historical Provider, MD  losartan (COZAAR) 50 MG tablet Take 50 mg by mouth daily.   Yes Historical Provider, MD  spironolactone (ALDACTONE) 25 MG tablet Take 25 mg by mouth daily.   Yes Historical Provider, MD  vitamin E 400 UNIT capsule Take 400 Units by mouth daily.   Yes Historical Provider, MD    HOSPITAL MEDICATIONS: I have reviewed the patient's current medications.  VITALS: Blood pressure 133/60, pulse 63, temperature 97.5 F (36.4 C), temperature source Oral, resp. rate 18, weight 191 lb 9.6 oz (86.9 kg), SpO2 98 %.  PHYSICAL EXAM: General appearance: alert, cooperative, no distress and moderately obese Neck: no carotid bruit and no JVD Lungs: clear to auscultation bilaterally Heart: regular rate and rhythm Abdomen: obese, no RUQ tenderness to deep palpation Extremities: extremities normal, atraumatic, no cyanosis or edema Pulses: 2+ and symmetric Skin: pale, cool, dry Neurologic: Grossly normal  LABS: Results for orders placed or performed during the hospital encounter of 02/05/16 (from the past 24 hour(s))  Basic metabolic panel     Status: Abnormal   Collection Time: 02/05/16 12:49 AM  Result Value Ref Range   Sodium 139 135 - 145 mmol/L   Potassium 4.1  3.5 - 5.1 mmol/L   Chloride 105 101 - 111 mmol/L   CO2 26 22 - 32 mmol/L   Glucose, Bld 105 (H) 65 - 99 mg/dL   BUN 16 6 - 20 mg/dL   Creatinine, Ser 0.89 0.44 - 1.00 mg/dL   Calcium 9.5 8.9 - 10.3 mg/dL   GFR calc non Af Amer >60 >60 mL/min   GFR calc Af Amer >60 >60 mL/min   Anion gap 8 5 - 15  CBC     Status: None   Collection Time: 02/05/16  12:49 AM  Result Value Ref Range   WBC 9.3 4.0 - 10.5 K/uL   RBC 4.37 3.87 - 5.11 MIL/uL   Hemoglobin 13.5 12.0 - 15.0 g/dL   HCT 40.2 36.0 - 46.0 %   MCV 92.0 78.0 - 100.0 fL   MCH 30.9 26.0 - 34.0 pg   MCHC 33.6 30.0 - 36.0 g/dL   RDW 13.3 11.5 - 15.5 %   Platelets 271 150 - 400 K/uL  I-stat troponin, ED     Status: None   Collection Time: 02/05/16 12:55 AM  Result Value Ref Range   Troponin i, poc 0.00 0.00 - 0.08 ng/mL   Comment 3          Troponin I-serum (0, 3, 6 hours)     Status: None   Collection Time: 02/05/16  7:03 AM  Result Value Ref Range   Troponin I <0.03 <0.03 ng/mL  Lipase, blood     Status: None   Collection Time: 02/05/16  7:03 AM  Result Value Ref Range   Lipase 37 11 - 51 U/L  Hepatic function panel     Status: None   Collection Time: 02/05/16  7:03 AM  Result Value Ref Range   Total Protein 6.7 6.5 - 8.1 g/dL   Albumin 4.0 3.5 - 5.0 g/dL   AST 25 15 - 41 U/L   ALT 18 14 - 54 U/L   Alkaline Phosphatase 53 38 - 126 U/L   Total Bilirubin 0.8 0.3 - 1.2 mg/dL   Bilirubin, Direct 0.3 0.1 - 0.5 mg/dL   Indirect Bilirubin 0.5 0.3 - 0.9 mg/dL  Troponin I-serum (0, 3, 6 hours)     Status: None   Collection Time: 02/05/16 10:23 AM  Result Value Ref Range   Troponin I <0.03 <0.03 ng/mL    EKG: NSR  IMAGING: Dg Chest 2 View  Result Date: 02/05/2016 CLINICAL DATA:  Mid to lower chest pain since this evening EXAM: CHEST  2 VIEW COMPARISON:  08/03/2009 FINDINGS: The heart size and mediastinal contours are within normal limits. Both lungs are clear. The visualized skeletal structures are unremarkable. IMPRESSION: No active cardiopulmonary disease. Electronically Signed   By: Ashley Royalty M.D.   On: 02/05/2016 01:24    IMPRESSION: Principal Problem:   Chest pain with moderate risk of acute coronary syndrome Active Problems:   Essential hypertension   Obesity   RECOMMENDATION: Will proceed with Lexiscan Myoview today- further recommendations pending.    Time Spent Directly with Patient: 40 minutes  Kerin Ransom, [place] beeper 02/05/2016, 11:33 AM   History and all data above reviewed.  Patient examined.  I agree with the findings as above.  The patient exam reveals COR:RRR  ,  Lungs: Clear  ,  Abd: Positive bowel sounds, no rebound no guarding, Ext No edema  .  All available labs, radiology testing, previous records reviewed. Agree with documented assessment and plan. Chest  pain:  Mostly atypical and without objective evidence of ischemia.  Lexiscan Myoview today.  No further work up if this is negative.   Discussed healthy lifestyle.    Jeneen Rinks Aiyah Scarpelli  3:23 PM  02/05/2016

## 2016-02-05 NOTE — Progress Notes (Signed)
   Letta Kocher presented for a lexiscan cardiolite today.  No immediate complications.  Stress imaging is pending at this time.  Murray Hodgkins, NP 02/05/2016, 1:35 PM

## 2016-02-05 NOTE — ED Notes (Signed)
Pt to xray

## 2016-02-05 NOTE — ED Notes (Signed)
Pt back from x-ray.

## 2016-02-05 NOTE — ED Provider Notes (Signed)
[facility] DEPT Provider Note   CSN: [id number] Arrival date & time: 02/05/16  0037  By signing my name below, I, [name], attest that this documentation has been prepared under the direction and in the presence of Ripley Fraise, MD . Electronically Signed: [name], Scribe. 02/05/2016. 2:00 AM.  History   Chief Complaint Chief Complaint  Patient presents with  . Chest Pain   The history is provided by the patient. No language interpreter was used.  Chest Pain   This is a new problem. The current episode started 3 to 5 hours ago. The problem occurs constantly. The problem has been gradually improving. The pain is present in the epigastric region. The pain is moderate. Quality: tight. The pain radiates to the lower back. Duration of episode(s) is 5 hours. Associated symptoms include abdominal pain (Mild), back pain, nausea and weakness (Generalized). Pertinent negatives include no diaphoresis, no numbness and no shortness of breath. She has tried nothing for the symptoms.  Her past medical history is significant for hypertension.  Pertinent negatives for past medical history include no MI and no strokes.  Pertinent negatives for family medical history include: no early MI.   HPI Comments: Sabrina Collins is a 65 y.o. female with a hx of HTN who presents to the Emergency Department complaining of constant moderate chest pain that started about 5 hours ago. She states she had a sudden onset of pain that is currently gradually improving. She describes the pain as a tight sensation radiating to her back. She reports associated symptoms of generalized weakness, nausea, and mild abdominal pain. She had no treatment PTA. She denies any hx of MI, hx of CVA, family hx of cardiac complications, LOC, extremity weakness, numbness, diaphoresis, SOB, or other complaints.   PCP: Dr. Uvaldo Rising    Past Medical History:  Diagnosis Date  . Hypertension     There are no active problems to  display for this patient.   History reviewed. No pertinent surgical history.  OB History    No data available       Home Medications    Prior to Admission medications   Medication Sig Start Date End Date Taking? Authorizing Provider  carvedilol (COREG) 6.25 MG tablet Take 6.25 mg by mouth 2 (two) times daily with a meal.   Yes Historical Provider, MD  diclofenac (VOLTAREN) 75 MG EC tablet Take 75 mg by mouth 2 (two) times daily.   Yes Historical Provider, MD  furosemide (LASIX) 40 MG tablet Take 40 mg by mouth daily.   Yes Historical Provider, MD  losartan (COZAAR) 50 MG tablet Take 50 mg by mouth daily.   Yes Historical Provider, MD  spironolactone (ALDACTONE) 25 MG tablet Take 25 mg by mouth daily.   Yes Historical Provider, MD  vitamin E 400 UNIT capsule Take 400 Units by mouth daily.   Yes Historical Provider, MD    Family History No family history on file.  Social History Social History  Substance Use Topics  . Smoking status: Never Smoker  . Smokeless tobacco: Never Used  . Alcohol use No     Allergies   Codeine and Penicillins   Review of Systems Review of Systems  Constitutional: Negative for diaphoresis.  Respiratory: Negative for shortness of breath.   Cardiovascular: Positive for chest pain.  Gastrointestinal: Positive for abdominal pain (Mild) and nausea.  Musculoskeletal: Positive for back pain.  Neurological: Positive for weakness (Generalized). Negative for syncope and numbness.  All other systems reviewed  and are negative.    Physical Exam Updated Vital Signs BP 115/80 (BP Location: Left Arm)   Pulse 89   Temp 98.3 F (36.8 C) (Oral)   Resp 18   SpO2 100%   Physical Exam  Nursing note and vitals reviewed.  CONSTITUTIONAL: Well developed/well nourished HEAD: Normocephalic/atraumatic EYES: EOMI/PERRL ENMT: Mucous membranes moist NECK: supple no meningeal signs SPINE/BACK:entire spine nontender CV: S1/S2 noted, no murmurs/rubs/gallops  noted LUNGS: Lungs are clear to auscultation bilaterally, no apparent distress ABDOMEN: soft, nontender, no rebound or guarding, bowel sounds noted throughout abdomen GU:no cva tenderness NEURO: Pt is awake/alert/appropriate, moves all extremitiesx4.  No facial droop.   EXTREMITIES: pulses normal/equal, full ROM, no calf TTP or edema. SKIN: warm, color normal PSYCH: no abnormalities of mood noted, alert and oriented to situation   ED Treatments / Results  DIAGNOSTIC STUDIES: Oxygen Saturation is 100% on RA, normal by my interpretation.    COORDINATION OF CARE: 2:05 AM- Pt advised of plan for treatment and pt agrees.  Labs (all labs ordered are listed, but only abnormal results are displayed) Labs Reviewed  BASIC METABOLIC PANEL - Abnormal; Notable for the following:       Result Value   Glucose, Bld 105 (*)    All other components within normal limits  CBC  I-STAT TROPOININ, ED    EKG  EKG Interpretation  Date/Time:  Wednesday February 05 2016 00:41:06 EST Ventricular Rate:  69 PR Interval:  186 QRS Duration: 80 QT Interval:  386 QTC Calculation: 413 R Axis:   -63 Text Interpretation:  Normal sinus rhythm Left axis deviation Possible Anterior infarct , age undetermined Abnormal ECG Interpretation limited secondary to artifact No significant change since last tracing Confirmed by Christy Gentles  MD, [place] ([id number]) on 02/05/2016 1:06:48 AM       Radiology Dg Chest 2 View  Result Date: 02/05/2016 CLINICAL DATA:  Mid to lower chest pain since this evening EXAM: CHEST  2 VIEW COMPARISON:  08/03/2009 FINDINGS: The heart size and mediastinal contours are within normal limits. Both lungs are clear. The visualized skeletal structures are unremarkable. IMPRESSION: No active cardiopulmonary disease. Electronically Signed   By: Ashley Royalty M.D.   On: 02/05/2016 01:24    Procedures Procedures (including critical care time)  Medications Ordered in ED Medications  nitroGLYCERIN  (NITROSTAT) SL tablet 0.4 mg (0.4 mg Sublingual Given 02/05/16 0324)  aspirin chewable tablet 324 mg (324 mg Oral Given 02/05/16 0210)     Initial Impression / Assessment and Plan / ED Course  I have reviewed the triage vital signs and the nursing notes.  Pertinent labs & imaging results that were available during my care of the patient were reviewed by me and considered in my medical decision making (see chart for details).  Clinical Course     Pt with chest tightness that improved with NTG She has no known CAD, but due to age/risk factors (HEART = 4) Will admit for further evaluation She is awake/alert, clinically appropriate at this time Low suspicion for PE/Dissection  D/w dr danford for admission at this time  Final Clinical Impressions(s) / ED Diagnoses   Final diagnoses:  Chest pain, rule out acute myocardial infarction    New Prescriptions New Prescriptions   No medications on file   I personally performed the services described in this documentation, which was scribed in my presence. The recorded information has been reviewed and is accurate.        Ripley Fraise, MD 02/05/16 [phone]

## 2016-02-05 NOTE — ED Notes (Signed)
EDP at bedside  

## 2016-02-06 DIAGNOSIS — I10 Essential (primary) hypertension: Secondary | ICD-10-CM | POA: Diagnosis not present

## 2016-02-06 DIAGNOSIS — E6609 Other obesity due to excess calories: Secondary | ICD-10-CM

## 2016-02-06 DIAGNOSIS — K219 Gastro-esophageal reflux disease without esophagitis: Secondary | ICD-10-CM | POA: Diagnosis not present

## 2016-02-06 DIAGNOSIS — R079 Chest pain, unspecified: Secondary | ICD-10-CM | POA: Diagnosis not present

## 2016-02-06 MED ORDER — FAMOTIDINE 20 MG PO TABS: 20.0000 mg | ORAL_TABLET | Freq: Two times a day (BID) | ORAL | 0 refills | Status: DC

## 2016-02-06 NOTE — Progress Notes (Signed)
Patient being discharged home per Dr. Eliseo Squires order. All discharge instructions given in written format to patient and her husband.

## 2016-02-06 NOTE — Progress Notes (Signed)
Patient Name: Sabrina Collins Date of Encounter: 02/06/2016  Primary Cardiologist: Dr [facility] Problem List     Principal Problem:   Chest pain with moderate risk of acute coronary syndrome Active Problems:   Essential hypertension   Obesity     Subjective   No further chest pain  Inpatient Medications    Scheduled Meds: . diclofenac  75 mg Oral BID  . enoxaparin (LOVENOX) injection  40 mg Subcutaneous Q24H  . famotidine  20 mg Oral BID  . furosemide  40 mg Oral Daily  . losartan  50 mg Oral Daily  . spironolactone  25 mg Oral Daily  . sucralfate  1 g Oral TID WC & HS   Continuous Infusions:  PRN Meds: acetaminophen, gi cocktail, ondansetron (ZOFRAN) IV   Vital Signs    Vitals:   02/05/16 1344 02/05/16 1609 02/05/16 1952 02/06/16 0429  BP:  (!) 148/68 137/70 131/73  Pulse: 98 72 87 80  Resp:  16    Temp:  98.1 F (36.7 C) 98.5 F (36.9 C) 98.4 F (36.9 C)  TempSrc:  Oral Oral Oral  SpO2:  100% 97% 96%  Weight:    197 lb 11.2 oz (89.7 kg)    Intake/Output Summary (Last 24 hours) at 02/06/16 0847 Last data filed at 02/05/16 1827  Gross per 24 hour  Intake              280 ml  Output                0 ml  Net              280 ml   Filed Weights   02/05/16 0600 02/06/16 0429  Weight: 191 lb 9.6 oz (86.9 kg) 197 lb 11.2 oz (89.7 kg)    Physical Exam    GEN: Overweight female, in no acute distress.  HEENT: Grossly normal.  Neck: Supple, no JVD Cardiac: RRR, no murmurs, rubs, or gallops. No clubbing, cyanosis, edema.  Radials/DP/PT 2+ and equal bilaterally.  Respiratory:  Respirations regular and unlabored, clear to auscultation bilaterally. MS: no deformity or atrophy. Skin: warm and dry, no rash. Neuro:  Strength and sensation are intact. Psych: AAOx3.  Normal affect.  Labs    CBC  Recent Labs  02/05/16 0049  WBC 9.3  HGB 13.5  HCT 40.2  MCV 92.0  PLT 99991111   Basic Metabolic Panel  Recent Labs  02/05/16 0049  NA 139  K  4.1  CL 105  CO2 26  GLUCOSE 105*  BUN 16  CREATININE 0.89  CALCIUM 9.5   Liver Function Tests  Recent Labs  02/05/16 0703  AST 25  ALT 18  ALKPHOS 53  BILITOT 0.8  PROT 6.7  ALBUMIN 4.0    Recent Labs  02/05/16 0703  LIPASE 37   Cardiac Enzymes  Recent Labs  02/05/16 0703 02/05/16 1023 02/05/16 1440  TROPONINI <0.03 <0.03 <0.03    Telemetry    NSR - Personally Reviewed  ECG    NSR - Personally Reviewed  Radiology    Dg Chest 2 View  Result Date: 02/05/2016 CLINICAL DATA:  Mid to lower chest pain since this evening EXAM: CHEST  2 VIEW COMPARISON:  08/03/2009 FINDINGS: The heart size and mediastinal contours are within normal limits. Both lungs are clear. The visualized skeletal structures are unremarkable. IMPRESSION: No active cardiopulmonary disease. Electronically Signed   By: Ashley Royalty M.D.   On: 02/05/2016 01:24  Nm Myocar Multi W/spect W/wall Motion / Ef  Result Date: 02/05/2016  There was no ST segment deviation noted during stress.  This is a low risk study.  The left ventricular ejection fraction is hyperdynamic (>65%).  Nuclear stress EF: 75%.  1. Normal LV systolic function and wall motion. 2. Low count rest study but stress study appears normal.  Suspect no ischemia or infarction, low risk study.   US Abdomen Limited Ruq  Result Date: 02/05/2016 CLINICAL DATA:  One day of right upper quadrant pain EXAM: US ABDOMEN LIMITED - RIGHT UPPER QUADRANT COMPARISON:  Abdominal ultrasound of May 13, 2007 FINDINGS: Gallbladder: The gallbladder is adequately distended. There are multiple echogenic mobile shadowing stones. There is no gallbladder wall thickening, pericholecystic fluid, or positive sonographic Murphy's sign. Common bile duct: Diameter: 8.5 mm which is dilated. No intraluminal stones or sludge are observed. Liver: The hepatic echotexture is heterogeneously increased. There is no focal mass or ductal dilation. The surface contour is  smooth. IMPRESSION: Gallstones without sonographic evidence of acute cholecystitis. Dilated common bile duct without intraluminal stones or sludge. The dilation may reflect recent passage of the stone however. No intrahepatic ductal dilation. Mildly increased hepatic echotexture compatible with fatty infiltrative change. Electronically Signed   By: David  Martinique M.D.   On: 02/05/2016 15:38    Cardiac Studies   See Myoview results above  Patient Profile     65 y/o overweight Caucasian female, with a history of HTN, seen by cardiology in 2011 (negative nuclear stress then), admitted with SSCP. Her Troponin are negative and her EKG shows no acute changes. Myoview 11/8 low risk.  Assessment & Plan    We can see again as needed. F/U with her PCP.   Angelena Form, PA-C  02/06/2016, 8:47 AM

## 2016-02-06 NOTE — Discharge Summary (Addendum)
Physician Discharge Summary  Sabrina Collins [id number] DOB: 02-20-1951 DOA: 02/05/2016  PCP: Imelda Pillow, NP  Admit date: 02/05/2016 Discharge date: 02/06/2016   Recommendations for Outpatient Follow-Up:   1. Consider outpatient EGD if symptoms not improved   Discharge Diagnosis:   Principal Problem:   Chest pain with moderate risk of acute coronary syndrome Active Problems:   Essential hypertension   Obesity   Discharge disposition:  Home:  Discharge Condition: Improved.  Diet recommendation: Low sodium, heart healthy.  Wound care: None.   History of Present Illness:   Sabrina Collins is a 65 y.o. female with a past medical history significant for HTN and obesity who presents with chest pain.  The patient was in her usual state of health until several days ago, she spent too long in a hot tub with friends, and since then she has felt "off" and tired and has foregone her usual exercise routine (treadmill, etc.).    Today, around 11A, she noticed onset of vague substernal/epigastric discomfort, mild in intensity, constant, not worse with anything in particular.  This persisted until tonight around 9 PM, a few hours after supper (meatloaf, vegetables), she had worsening of her chest pain was severe, constant epigastric pain radiating to her back, worse with laying down, associated with nausea but no vomiting. This lasted for several hours until she got her husband up from bed and had him bring her to the hospital.   Hospital Course by Problem:   Chest pain suspect noncardiac due to GERD -low risk stress test -pain improved with pepcid -outpatient EGD if worsens -RUQ U/S done--     Medical Consultants:    cards   Discharge Exam:   Vitals:   02/05/16 1952 02/06/16 0429  BP: 137/70 131/73  Pulse: 87 80  Resp:    Temp: 98.5 F (36.9 C) 98.4 F (36.9 C)   Vitals:   02/05/16 1344 02/05/16 1609 02/05/16 1952 02/06/16 0429  BP:  (!) 148/68  137/70 131/73  Pulse: 98 72 87 80  Resp:  16    Temp:  98.1 F (36.7 C) 98.5 F (36.9 C) 98.4 F (36.9 C)  TempSrc:  Oral Oral Oral  SpO2:  100% 97% 96%  Weight:    89.7 kg (197 lb 11.2 oz)    Gen:  NAD   The results of significant diagnostics from this hospitalization (including imaging, microbiology, ancillary and laboratory) are listed below for reference.     Procedures and Diagnostic Studies:   Dg Chest 2 View  Result Date: 02/05/2016 CLINICAL DATA:  Mid to lower chest pain since this evening EXAM: CHEST  2 VIEW COMPARISON:  08/03/2009 FINDINGS: The heart size and mediastinal contours are within normal limits. Both lungs are clear. The visualized skeletal structures are unremarkable. IMPRESSION: No active cardiopulmonary disease. Electronically Signed   By: Ashley Royalty M.D.   On: 02/05/2016 01:24   Nm Myocar Multi W/spect W/wall Motion / Ef  Result Date: 02/05/2016  There was no ST segment deviation noted during stress.  This is a low risk study.  The left ventricular ejection fraction is hyperdynamic (>65%).  Nuclear stress EF: 75%.  1. Normal LV systolic function and wall motion. 2. Low count rest study but stress study appears normal.  Suspect no ischemia or infarction, low risk study.   US Abdomen Limited Ruq  Result Date: 02/05/2016 CLINICAL DATA:  One day of right upper quadrant pain EXAM: US ABDOMEN LIMITED - RIGHT UPPER QUADRANT  COMPARISON:  Abdominal ultrasound of May 13, 2007 FINDINGS: Gallbladder: The gallbladder is adequately distended. There are multiple echogenic mobile shadowing stones. There is no gallbladder wall thickening, pericholecystic fluid, or positive sonographic Murphy's sign. Common bile duct: Diameter: 8.5 mm which is dilated. No intraluminal stones or sludge are observed. Liver: The hepatic echotexture is heterogeneously increased. There is no focal mass or ductal dilation. The surface contour is smooth. IMPRESSION: Gallstones without  sonographic evidence of acute cholecystitis. Dilated common bile duct without intraluminal stones or sludge. The dilation may reflect recent passage of the stone however. No intrahepatic ductal dilation. Mildly increased hepatic echotexture compatible with fatty infiltrative change. Electronically Signed   By: David  Martinique M.D.   On: 02/05/2016 15:38     Labs:   Basic Metabolic Panel:  Recent Labs Lab 02/05/16 0049  NA 139  K 4.1  CL 105  CO2 26  GLUCOSE 105*  BUN 16  CREATININE 0.89  CALCIUM 9.5   GFR CrCl cannot be calculated (Unknown ideal weight.). Liver Function Tests:  Recent Labs Lab 02/05/16 0703  AST 25  ALT 18  ALKPHOS 53  BILITOT 0.8  PROT 6.7  ALBUMIN 4.0    Recent Labs Lab 02/05/16 0703  LIPASE 37   No results for input(s): AMMONIA in the last 168 hours. Coagulation profile No results for input(s): INR, PROTIME in the last 168 hours.  CBC:  Recent Labs Lab 02/05/16 0049  WBC 9.3  HGB 13.5  HCT 40.2  MCV 92.0  PLT 271   Cardiac Enzymes:  Recent Labs Lab 02/05/16 0703 02/05/16 1023 02/05/16 1440  TROPONINI <0.03 <0.03 <0.03   BNP: Invalid input(s): POCBNP CBG: No results for input(s): GLUCAP in the last 168 hours. D-Dimer No results for input(s): DDIMER in the last 72 hours. Hgb A1c No results for input(s): HGBA1C in the last 72 hours. Lipid Profile No results for input(s): CHOL, HDL, LDLCALC, TRIG, CHOLHDL, LDLDIRECT in the last 72 hours. Thyroid function studies No results for input(s): TSH, T4TOTAL, T3FREE, THYROIDAB in the last 72 hours.  Invalid input(s): FREET3 Anemia work up No results for input(s): VITAMINB12, FOLATE, FERRITIN, TIBC, IRON, RETICCTPCT in the last 72 hours. Microbiology No results found for this or any previous visit (from the past 240 hour(s)).   Discharge Instructions:   Discharge Instructions    Diet - low sodium heart healthy    Complete by:  As directed    Discharge instructions     Complete by:  As directed    Outpatient EGD   Increase activity slowly    Complete by:  As directed        Medication List    TAKE these medications   carvedilol 6.25 MG tablet Commonly known as:  COREG Take 6.25 mg by mouth 2 (two) times daily with a meal.   diclofenac 75 MG EC tablet Commonly known as:  VOLTAREN Take 75 mg by mouth 2 (two) times daily.   famotidine 20 MG tablet Commonly known as:  PEPCID Take 1 tablet (20 mg total) by mouth 2 (two) times daily.   furosemide 40 MG tablet Commonly known as:  LASIX Take 40 mg by mouth daily.   losartan 50 MG tablet Commonly known as:  COZAAR Take 50 mg by mouth daily.   spironolactone 25 MG tablet Commonly known as:  ALDACTONE Take 25 mg by mouth daily.   vitamin E 400 UNIT capsule Take 400 Units by mouth daily.      Follow-up  Information    Millsaps, Luane School, NP Follow up in 1 week(s).   Why:  outpatient referral to GI for possible EGD if pepcid does not treat symptoms Contact information: [facility] [place] [address] [phone]            Time coordinating discharge: 35 min  Signed:  Mattelyn Imhoff Alison Stalling   [facility] 02/06/2016, 8:56 AM

## 2018-02-27 DIAGNOSIS — E876 Hypokalemia: Secondary | ICD-10-CM

## 2018-02-27 HISTORY — DX: Hypokalemia: E87.6

## 2018-03-08 ENCOUNTER — Other Ambulatory Visit: Payer: Self-pay

## 2018-03-08 ENCOUNTER — Encounter (HOSPITAL_COMMUNITY): Payer: Self-pay | Admitting: Emergency Medicine

## 2018-03-08 ENCOUNTER — Emergency Department (HOSPITAL_COMMUNITY): Payer: Medicare Other

## 2018-03-08 ENCOUNTER — Observation Stay (HOSPITAL_COMMUNITY)
Admission: EM | Admit: 2018-03-08 | Discharge: 2018-03-10 | Disposition: A | Discharge status: Home / Self Care | Payer: Medicare Other | Attending: Internal Medicine | Admitting: Internal Medicine

## 2018-03-08 DIAGNOSIS — E878 Other disorders of electrolyte and fluid balance, not elsewhere classified: Secondary | ICD-10-CM

## 2018-03-08 DIAGNOSIS — I10 Essential (primary) hypertension: Secondary | ICD-10-CM | POA: Diagnosis not present

## 2018-03-08 DIAGNOSIS — E860 Dehydration: Secondary | ICD-10-CM

## 2018-03-08 DIAGNOSIS — Z79899 Other long term (current) drug therapy: Secondary | ICD-10-CM | POA: Insufficient documentation

## 2018-03-08 DIAGNOSIS — D259 Leiomyoma of uterus, unspecified: Secondary | ICD-10-CM | POA: Insufficient documentation

## 2018-03-08 DIAGNOSIS — K5730 Diverticulosis of large intestine without perforation or abscess without bleeding: Secondary | ICD-10-CM | POA: Insufficient documentation

## 2018-03-08 DIAGNOSIS — K449 Diaphragmatic hernia without obstruction or gangrene: Secondary | ICD-10-CM | POA: Diagnosis not present

## 2018-03-08 DIAGNOSIS — R531 Weakness: Secondary | ICD-10-CM | POA: Diagnosis not present

## 2018-03-08 DIAGNOSIS — E876 Hypokalemia: Secondary | ICD-10-CM | POA: Diagnosis not present

## 2018-03-08 DIAGNOSIS — Z79890 Hormone replacement therapy (postmenopausal): Secondary | ICD-10-CM | POA: Diagnosis not present

## 2018-03-08 DIAGNOSIS — K219 Gastro-esophageal reflux disease without esophagitis: Secondary | ICD-10-CM | POA: Diagnosis not present

## 2018-03-08 DIAGNOSIS — Z791 Long term (current) use of non-steroidal anti-inflammatories (NSAID): Secondary | ICD-10-CM | POA: Diagnosis not present

## 2018-03-08 DIAGNOSIS — N950 Postmenopausal bleeding: Secondary | ICD-10-CM | POA: Diagnosis present

## 2018-03-08 DIAGNOSIS — R112 Nausea with vomiting, unspecified: Secondary | ICD-10-CM | POA: Diagnosis present

## 2018-03-08 DIAGNOSIS — R9431 Abnormal electrocardiogram [ECG] [EKG]: Secondary | ICD-10-CM | POA: Diagnosis not present

## 2018-03-08 HISTORY — DX: Hypokalemia: E87.6

## 2018-03-08 HISTORY — DX: Unspecified asthma, uncomplicated: J45.909

## 2018-03-08 LAB — CBC
HEMATOCRIT: 45.1 % (ref 36.0–46.0)
Hemoglobin: 15.3 g/dL — ABNORMAL HIGH (ref 12.0–15.0)
MCH: 30.1 pg (ref 26.0–34.0)
MCHC: 33.9 g/dL (ref 30.0–36.0)
MCV: 88.8 fL (ref 80.0–100.0)
Platelets: 356 10*3/uL (ref 150–400)
RBC: 5.08 MIL/uL (ref 3.87–5.11)
RDW: 12.6 % (ref 11.5–15.5)
WBC: 13.3 10*3/uL — ABNORMAL HIGH (ref 4.0–10.5)
nRBC: 0 % (ref 0.0–0.2)

## 2018-03-08 LAB — BASIC METABOLIC PANEL
ANION GAP: 15 (ref 5–15)
Anion gap: 19 — ABNORMAL HIGH (ref 5–15)
BUN: 21 mg/dL (ref 8–23)
BUN: 17 mg/dL (ref 8–23)
CO2: 38 mmol/L — ABNORMAL HIGH (ref 22–32)
CO2: 34 mmol/L — ABNORMAL HIGH (ref 22–32)
CREATININE: 1.27 mg/dL — AB (ref 0.44–1.00)
Calcium: 9.5 mg/dL (ref 8.9–10.3)
Calcium: 8.1 mg/dL — ABNORMAL LOW (ref 8.9–10.3)
Chloride: 78 mmol/L — ABNORMAL LOW (ref 98–111)
Chloride: 87 mmol/L — ABNORMAL LOW (ref 98–111)
Creatinine, Ser: 1.08 mg/dL — ABNORMAL HIGH (ref 0.44–1.00)
GFR calc Af Amer: 52 mL/min — ABNORMAL LOW (ref >60)
GFR calc Af Amer: >60 mL/min (ref >60)
GFR calc non Af Amer: 45 mL/min — ABNORMAL LOW (ref >60)
GFR calc non Af Amer: 54 mL/min — ABNORMAL LOW (ref >60)
GLUCOSE: 114 mg/dL — AB (ref 70–99)
Glucose, Bld: 118 mg/dL — ABNORMAL HIGH (ref 70–99)
Potassium: 2.7 mmol/L — CL (ref 3.5–5.1)
Potassium: <2 mmol/L — CL (ref 3.5–5.1)
Sodium: 135 mmol/L (ref 135–145)
Sodium: 136 mmol/L (ref 135–145)

## 2018-03-08 LAB — URINALYSIS, ROUTINE W REFLEX MICROSCOPIC
Bilirubin Urine: NEGATIVE
Glucose, UA: NEGATIVE mg/dL
Ketones, ur: 5 mg/dL — AB
Leukocytes, UA: NEGATIVE
Nitrite: NEGATIVE
PROTEIN: NEGATIVE mg/dL
Specific Gravity, Urine: 1.021 (ref 1.005–1.030)
pH: 5 (ref 5.0–8.0)

## 2018-03-08 LAB — CBG MONITORING, ED
GLUCOSE-CAPILLARY: 120 mg/dL — AB (ref 70–99)
GLUCOSE-CAPILLARY: 99 mg/dL (ref 70–99)

## 2018-03-08 LAB — CK: Total CK: 144 U/L (ref 38–234)

## 2018-03-08 LAB — I-STAT CG4 LACTIC ACID, ED: Lactic Acid, Venous: 1.34 mmol/L (ref 0.5–1.9)

## 2018-03-08 MED ORDER — ONDANSETRON HCL 4 MG/2ML IJ SOLN
4.0000 mg | Freq: Once | INTRAMUSCULAR | Status: AC
  Administered 2018-03-08: 4 mg via INTRAVENOUS
  Filled 2018-03-08: qty 2

## 2018-03-08 MED ORDER — IOHEXOL 300 MG/ML  SOLN
80.0000 mL | Freq: Once | INTRAMUSCULAR | Status: AC | PRN
  Administered 2018-03-08: 80 mL via INTRAVENOUS

## 2018-03-08 MED ORDER — SODIUM CHLORIDE 0.9 % IV BOLUS
1000.0000 mL | Freq: Once | INTRAVENOUS | Status: AC
  Administered 2018-03-08: 1000 mL via INTRAVENOUS

## 2018-03-08 MED ORDER — POTASSIUM CHLORIDE CRYS ER 20 MEQ PO TBCR
60.0000 meq | EXTENDED_RELEASE_TABLET | Freq: Once | ORAL | Status: AC
  Administered 2018-03-08: 60 meq via ORAL
  Filled 2018-03-08: qty 3

## 2018-03-08 MED ORDER — ONDANSETRON 4 MG PO TBDP: 4.0000 mg | ORAL_TABLET | Freq: Three times a day (TID) | ORAL | 0 refills | Status: DC | PRN

## 2018-03-08 MED ORDER — POTASSIUM CHLORIDE CRYS ER 20 MEQ PO TBCR: 20.0000 meq | EXTENDED_RELEASE_TABLET | Freq: Two times a day (BID) | ORAL | 0 refills | Status: DC

## 2018-03-08 MED ORDER — POTASSIUM CHLORIDE 10 MEQ/100ML IV SOLN
10.0000 meq | INTRAVENOUS | Status: DC
  Administered 2018-03-09: 10 meq via INTRAVENOUS
  Filled 2018-03-08: qty 100

## 2018-03-08 NOTE — ED Triage Notes (Addendum)
Pt coming from doctors office follow up visit for weakness and decreased appetite that has been going on for two weeks. Pt received 8mg  of zofran at doctors office for nausea. Denies V/D. Potassium 2.9 at doctors office. Pt has lost 16lbs since September. Denies any pain. Pt tearful in triage.

## 2018-03-08 NOTE — ED Provider Notes (Signed)
67 year old female received at sign out from [place] pending repeat labs. Per his HPI:  "Patient with history of hypertension, GERD, LE edema for which she takes Lasix/spironolactone --presents the emergency department with ongoing severe nausea, decreased oral intake, generalized weakness over the past 2 weeks.  Patient has had medical work-up by her primary care physician who sent her over to the emergency department today for low potassium as well as concern for dehydration.  Patient has reported approximately 15 pound weight loss over the past several months.  She has not been trying to lose weight.  She has had some intermittent lower abdominal pains, not currently present.  She reports an episode of vaginal spotting last week that was minimal, again no active spotting.  She was given Zofran at her doctor's office which temporarily improved her symptoms however now she is nauseous.  She has generalized weakness in her body but no focal weakness.  No confusion, slurred speech, facial droop, imbalance reported.  Patient denies any chest pains or shortness of breath.  Onset of symptoms insidious.  Course is worsening.  Nothing makes symptoms worse."  Physical Exam  BP (!) 123/58 (BP Location: Right Arm)   Pulse 72   Temp 98.4 F (36.9 C) (Oral)   Resp 14   Ht 5' (1.524 m)   Wt 94.3 kg   SpO2 98%   BMI 40.62 kg/m   Physical Exam  Ambulatory. NAD.  ED Course/Procedures     Procedures  MDM   67 year old female received a signout from [facility] pending repeat BMP.  Initial potassium was 2.7 and on repeat was <2.0 after she was given 2 L of normal saline and 60 mEq of oral potassium chloride.  The patient was discussed with Dr. Sedonia Small, attending physician.  30 meq of IV potassium chloride and 2 g of IV magnesium sulfate have been ordered in the ED.  Repeat EKG unchanged from previous. Consulted the hospitalist team and spoke with Dr. Alcario Drought who will accept the patient for admission. The  patient appears reasonably stabilized for admission considering the current resources, flow, and capabilities available in the ED at this time, and I doubt any other [facility] requiring further screening and/or treatment in the ED prior to admission.    Joanne Gavel, PA-C 03/09/18 0636    Maudie Flakes, MD 03/09/18 2351

## 2018-03-08 NOTE — Discharge Instructions (Signed)
Please read and follow all provided instructions.  Your diagnoses today include:  1. Hypokalemia   2. Hypochloremia   3. Dehydration     Tests performed today include:  Blood counts   Electrolytes - shows low potassium, low chloride  CT scan - does not show any problems in the abdomen that would cause your symptoms  Vital signs. See below for your results today.   Medications prescribed:   Zofran (ondansetron) - for nausea and vomiting  Take any prescribed medications only as directed.  Home care instructions:  Follow any educational materials contained in this packet.  For the time being, stop taking the furosemide until instructed to resume by your doctor.  This is likely making your dehydration, low potassium, and low chloride worse.  Follow-up instructions: Please follow-up with your primary care provider in the next 2 days for further evaluation of your symptoms.   Return instructions:   Please return to the Emergency Department if you experience worsening symptoms.   Return to the emergency department with worsening symptoms, persistent vomiting, development of persistent abdominal pain.  Please return if you have any other emergent concerns.  Additional Information:  Your vital signs today were: BP (!) 154/71    Pulse 86    Temp 98.4 F (36.9 C) (Oral)    Resp (!) 22    Ht 5' (1.524 m)    Wt 94.3 kg    SpO2 98%    BMI 40.62 kg/m  If your blood pressure (BP) was elevated above 135/85 this visit, please have this repeated by your doctor within one month. --------------

## 2018-03-08 NOTE — ED Provider Notes (Signed)
[facility] EMERGENCY DEPARTMENT Provider Note   CSN: [id number] Arrival date & time: 03/08/18  1657     History   Chief Complaint Chief Complaint  Patient presents with  . Weakness  . Dehydration    HPI Sabrina Collins is a 67 y.o. female.  Patient with history of hypertension, GERD, LE edema for which she takes Lasix/spironolactone --presents the emergency department with ongoing severe nausea, decreased oral intake, generalized weakness over the past 2 weeks.  Patient has had medical work-up by her primary care physician who sent her over to the emergency department today for low potassium as well as concern for dehydration.  Patient has reported approximately 15 pound weight loss over the past several months.  She has not been trying to lose weight.  She has had some intermittent lower abdominal pains, not currently present.  She reports an episode of vaginal spotting last week that was minimal, again no active spotting.  She was given Zofran at her doctor's office which temporarily improved her symptoms however now she is nauseous.  She has generalized weakness in her body but no focal weakness.  No confusion, slurred speech, facial droop, imbalance reported.  Patient denies any chest pains or shortness of breath.  Onset of symptoms insidious.  Course is worsening.  Nothing makes symptoms worse.     Past Medical History:  Diagnosis Date  . Hypertension     Patient Active Problem List   Diagnosis Date Noted  . Gastroesophageal reflux disease   . Chest pain with moderate risk of acute coronary syndrome 02/05/2016  . Essential hypertension 02/05/2016  . Obesity 02/05/2016    History reviewed. No pertinent surgical history.   OB History   None      Home Medications    Prior to Admission medications   Medication Sig Start Date End Date Taking? Authorizing Provider  carvedilol (COREG) 6.25 MG tablet Take 6.25 mg by mouth 2 (two) times daily with a  meal.    [provider]  diclofenac (VOLTAREN) 75 MG EC tablet Take 75 mg by mouth 2 (two) times daily.    [provider]  famotidine (PEPCID) 20 MG tablet Take 1 tablet (20 mg total) by mouth 2 (two) times daily. 02/06/16   Geradine Girt, DO  furosemide (LASIX) 40 MG tablet Take 40 mg by mouth daily.    [provider]  losartan (COZAAR) 50 MG tablet Take 50 mg by mouth daily.    [provider]  spironolactone (ALDACTONE) 25 MG tablet Take 25 mg by mouth daily.    [provider]  vitamin E 400 UNIT capsule Take 400 Units by mouth daily.    [provider]    Family History Family History  Problem Relation Age of Onset  . Lung cancer Mother   . Colon cancer Father   . Ulcers Father   . Asthma Daughter     Social History Social History   Tobacco Use  . Smoking status: Never Smoker  . Smokeless tobacco: Never Used  Substance Use Topics  . Alcohol use: No  . Drug use: No     Allergies   Codeine and Penicillins   Review of Systems Review of Systems  Constitutional: Positive for activity change and unexpected weight change. Negative for fever.  HENT: Negative for rhinorrhea and sore throat.   Eyes: Negative for redness.  Respiratory: Negative for cough, chest tightness and shortness of breath.   Cardiovascular: Negative for  chest pain.  Gastrointestinal: Positive for abdominal pain and nausea. Negative for diarrhea and vomiting.  Genitourinary: Positive for vaginal bleeding. Negative for dysuria.  Musculoskeletal: Negative for myalgias.  Skin: Negative for rash.  Neurological: Negative for headaches.     Physical Exam Updated Vital Signs BP (!) 154/71   Pulse 86   Temp 98.4 F (36.9 C) (Oral)   Resp (!) 22   Ht 5' (1.524 m)   Wt 94.3 kg   SpO2 98%   BMI 40.62 kg/m   Physical Exam  Constitutional: She appears well-developed and well-nourished.  HENT:  Head: Normocephalic and atraumatic.  Dry mucous  membranes.  Oropharynx clear.  Eyes: Conjunctivae are normal. Right eye exhibits no discharge. Left eye exhibits no discharge.  Neck: Normal range of motion. Neck supple.  Cardiovascular: Normal rate, regular rhythm and normal heart sounds.  No murmur heard. Pulmonary/Chest: Effort normal and breath sounds normal. No respiratory distress. She has no wheezes. She has no rales.  Abdominal: Soft. There is no tenderness. There is no rebound and no guarding.  Musculoskeletal: She exhibits no edema or tenderness.  Neurological: She is alert. No cranial nerve deficit. She exhibits normal muscle tone.  Skin: Skin is warm and dry.  Psychiatric: She has a normal mood and affect.  Nursing note and vitals reviewed.    ED Treatments / Results  Labs (all labs ordered are listed, but only abnormal results are displayed) Labs Reviewed  BASIC METABOLIC PANEL - Abnormal; Notable for the following components:      Result Value   Potassium 2.7 (*)    Chloride 78 (*)    CO2 38 (*)    Glucose, Bld 118 (*)    Creatinine, Ser 1.27 (*)    GFR calc non Af Amer 45 (*)    GFR calc Af Amer 52 (*)    Anion gap 19 (*)    All other components within normal limits  CBC - Abnormal; Notable for the following components:   WBC 13.3 (*)    Hemoglobin 15.3 (*)    All other components within normal limits  URINALYSIS, ROUTINE W REFLEX MICROSCOPIC - Abnormal; Notable for the following components:   APPearance CLOUDY (*)    Hgb urine dipstick MODERATE (*)    Ketones, ur 5 (*)    Bacteria, UA RARE (*)    All other components within normal limits  CBG MONITORING, ED - Abnormal; Notable for the following components:   Glucose-Capillary 120 (*)    All other components within normal limits  CK  BASIC METABOLIC PANEL  CBG MONITORING, ED  I-STAT CG4 LACTIC ACID, ED    EKG EKG Interpretation  Date/Time:  Tuesday March 08 2018 17:32:11 EST Ventricular Rate:  86 PR Interval:  164 QRS Duration: 94 QT  Interval:  400 QTC Calculation: 478 R Axis:   -75 Text Interpretation:  Normal sinus rhythm Left axis deviation Abnormal ECG Confirmed by Gerlene Fee [id number]) on 03/08/2018 10:10:32 PM   Radiology Ct Abdomen Pelvis W Contrast  Result Date: 03/08/2018 CLINICAL DATA:  67 year old female with generalized weakness and loss of appetite. EXAM: CT ABDOMEN AND PELVIS WITH CONTRAST TECHNIQUE: Multidetector CT imaging of the abdomen and pelvis was performed using the standard protocol following bolus administration of intravenous contrast. CONTRAST:  45mL OMNIPAQUE IOHEXOL 300 MG/ML  SOLN COMPARISON:  Some abdominal ultrasound dated 02/05/2016 FINDINGS: Lower chest: The visualized lung bases are clear. No intra-abdominal free air or free fluid. Hepatobiliary: The liver is unremarkable.  No intrahepatic biliary ductal dilatation. The gallbladder is unremarkable. Pancreas: Unremarkable. No pancreatic ductal dilatation or surrounding inflammatory changes. Spleen: Normal in size without focal abnormality. Adrenals/Urinary Tract: The adrenal glands are unremarkable. There is no hydronephrosis on either side. There is symmetric enhancement and excretion of contrast by both kidneys. The visualized ureters and urinary bladder appear unremarkable. Stomach/Bowel: There is scattered sigmoid diverticula without active inflammatory changes. There is no bowel obstruction or active inflammation. Normal appendix. Small hiatal hernia. Vascular/Lymphatic: The abdominal aorta and IVC are unremarkable. No portal venous gas. There is no adenopathy. Reproductive: The uterus is anteverted. Fundal fibroid noted. The ovaries are grossly unremarkable. Other: None Musculoskeletal: Multilevel facet arthropathy. No acute osseous pathology. IMPRESSION: 1. No acute intra-abdominal or pelvic pathology. No bowel obstruction or active inflammation. Normal appendix. 2. Scattered sigmoid diverticula without active inflammatory changes. 3. Fibroid  uterus. Electronically Signed   By: Anner Crete M.D.   On: 03/08/2018 21:53    Procedures Procedures (including critical care time)  Medications Ordered in ED Medications  sodium chloride 0.9 % bolus 1,000 mL (0 mLs Intravenous Stopped 03/08/18 2228)  potassium chloride SA (K-DUR,KLOR-CON) CR tablet 60 mEq (60 mEq Oral Given 03/08/18 2046)  ondansetron (ZOFRAN) injection 4 mg (4 mg Intravenous Given 03/08/18 2053)  iohexol (OMNIPAQUE) 300 MG/ML solution 80 mL (80 mLs Intravenous Contrast Given 03/08/18 2136)  sodium chloride 0.9 % bolus 1,000 mL (1,000 mLs Intravenous New Bag/Given 03/08/18 2215)     Initial Impression / Assessment and Plan / ED Course  I have reviewed the triage vital signs and the nursing notes.  Pertinent labs & imaging results that were available during my care of the patient were reviewed by me and considered in my medical decision making (see chart for details).     Patient seen and examined. Work-up initiated.  Labs reviewed. Suggests metabolic alkalosis, low potassium and low chloride in setting of diuretic use.  Fluids and potassium ordered.  She has had some intermittent abdominal pains.  Given this with complaint of severe nausea, early satiety, weight loss -- CT abdomen and pelvis ordered for further delineation and evaluation.  Patient is motivated to go home and would prefer not to stay in the hospital.  Discussed case with Dr. Sedonia Small.   Vital signs reviewed and are as follows: BP (!) 154/71   Pulse 86   Temp 98.4 F (36.9 C) (Oral)   Resp (!) 22   Ht 5' (1.524 m)   Wt 94.3 kg   SpO2 98%   BMI 40.62 kg/m   11:28 PM Labs and CT reviewed with Dr. Sedonia Small who has seen patient.   Patient is feeling better after fluids. She is eating. She wants to go home. Plan: give 2nd L of fluids, recheck BMP.   If improved, home with K supplement, d/c Lasix for now, zofran for nausea. She will need to follow-up closely with her doctor.    11:40 PM Handoff to  McDonald PA-C at shift change.   Final Clinical Impressions(s) / ED Diagnoses   Final diagnoses:  Hypokalemia  Hypochloremia  Dehydration   Pending re-evaluation after fluids.  Patient with metabolic alkalosis with elevated bicarb, hypokalemia, hypo-chloremia.  Suspect that this is been exacerbated by poor oral intake in setting of recent nausea with continued use of furosemide.  CT ordered 2/2 intermittent abdominal pains, weight loss.  Fortunately this was negative.  Offered patient admission however she would like to go home today.  She is tolerating orals in  the emergency department.  Patient was hydrated with 2 L of normal saline.  Pending BMP recheck.  ED Discharge Orders         Ordered    ondansetron (ZOFRAN ODT) 4 MG disintegrating tablet  Every 8 hours PRN     03/08/18 2325    potassium chloride SA (K-DUR,KLOR-CON) 20 MEQ tablet  2 times daily     03/08/18 2325           Carlisle Cater, PA-C 03/08/18 2341    Maudie Flakes, MD 03/09/18 [phone]

## 2018-03-08 NOTE — ED Notes (Signed)
To CT

## 2018-03-09 ENCOUNTER — Encounter (HOSPITAL_COMMUNITY): Payer: Self-pay | Admitting: General Practice

## 2018-03-09 ENCOUNTER — Observation Stay (HOSPITAL_COMMUNITY): Payer: Medicare Other

## 2018-03-09 DIAGNOSIS — I10 Essential (primary) hypertension: Secondary | ICD-10-CM | POA: Diagnosis not present

## 2018-03-09 DIAGNOSIS — N950 Postmenopausal bleeding: Secondary | ICD-10-CM | POA: Diagnosis present

## 2018-03-09 DIAGNOSIS — E876 Hypokalemia: Secondary | ICD-10-CM | POA: Diagnosis not present

## 2018-03-09 DIAGNOSIS — E860 Dehydration: Secondary | ICD-10-CM | POA: Diagnosis not present

## 2018-03-09 DIAGNOSIS — R112 Nausea with vomiting, unspecified: Secondary | ICD-10-CM | POA: Diagnosis not present

## 2018-03-09 LAB — BASIC METABOLIC PANEL
Anion gap: 13 (ref 5–15)
BUN: 17 mg/dL (ref 8–23)
CO2: 34 mmol/L — ABNORMAL HIGH (ref 22–32)
Calcium: 8.3 mg/dL — ABNORMAL LOW (ref 8.9–10.3)
Chloride: 90 mmol/L — ABNORMAL LOW (ref 98–111)
Creatinine, Ser: 1.14 mg/dL — ABNORMAL HIGH (ref 0.44–1.00)
GFR calc Af Amer: 59 mL/min — ABNORMAL LOW (ref >60)
GFR, EST NON AFRICAN AMERICAN: 51 mL/min — AB (ref >60)
Glucose, Bld: 112 mg/dL — ABNORMAL HIGH (ref 70–99)
Potassium: 2.6 mmol/L — CL (ref 3.5–5.1)
Sodium: 137 mmol/L (ref 135–145)

## 2018-03-09 LAB — RAPID URINE DRUG SCREEN, HOSP PERFORMED
Amphetamines: NOT DETECTED
Barbiturates: NOT DETECTED
Benzodiazepines: NOT DETECTED
Cocaine: NOT DETECTED
Opiates: NOT DETECTED
Tetrahydrocannabinol: NOT DETECTED

## 2018-03-09 LAB — MAGNESIUM: Magnesium: 1.8 mg/dL (ref 1.7–2.4)

## 2018-03-09 LAB — HIV ANTIBODY (ROUTINE TESTING W REFLEX): HIV Screen 4th Generation wRfx: NONREACTIVE

## 2018-03-09 LAB — POTASSIUM
Potassium: 2.8 mmol/L — ABNORMAL LOW (ref 3.5–5.1)
Potassium: 3.2 mmol/L — ABNORMAL LOW (ref 3.5–5.1)

## 2018-03-09 MED ORDER — ONDANSETRON HCL 4 MG PO TABS: 4.0000 mg | ORAL_TABLET | Freq: Four times a day (QID) | ORAL | Status: DC | PRN

## 2018-03-09 MED ORDER — POTASSIUM CHLORIDE CRYS ER 20 MEQ PO TBCR: 40.0000 meq | EXTENDED_RELEASE_TABLET | Freq: Once | ORAL | Status: DC

## 2018-03-09 MED ORDER — POTASSIUM CHLORIDE 10 MEQ/100ML IV SOLN
10.0000 meq | INTRAVENOUS | Status: AC
  Administered 2018-03-09 (×2): 10 meq via INTRAVENOUS
  Filled 2018-03-09 (×3): qty 100

## 2018-03-09 MED ORDER — MAGNESIUM SULFATE 2 GM/50ML IV SOLN
2.0000 g | Freq: Once | INTRAVENOUS | Status: AC
  Administered 2018-03-09: 2 g via INTRAVENOUS
  Filled 2018-03-09: qty 50

## 2018-03-09 MED ORDER — ACETAMINOPHEN 325 MG PO TABS: 650.0000 mg | ORAL_TABLET | Freq: Four times a day (QID) | ORAL | Status: DC | PRN

## 2018-03-09 MED ORDER — ENOXAPARIN SODIUM 40 MG/0.4ML ~~LOC~~ SOLN
40.0000 mg | Freq: Every day | SUBCUTANEOUS | Status: DC
  Administered 2018-03-10: 40 mg via SUBCUTANEOUS
  Filled 2018-03-09 (×3): qty 0.4

## 2018-03-09 MED ORDER — POTASSIUM CHLORIDE 20 MEQ/15ML (10%) PO SOLN
40.0000 meq | Freq: Once | ORAL | Status: AC
  Administered 2018-03-09: 40 meq via ORAL
  Filled 2018-03-09: qty 30

## 2018-03-09 MED ORDER — ENSURE ENLIVE PO LIQD
237.0000 mL | Freq: Two times a day (BID) | ORAL | Status: DC
  Administered 2018-03-09 – 2018-03-10 (×2): 237 mL via ORAL
  Filled 2018-03-09 (×5): qty 237

## 2018-03-09 MED ORDER — POTASSIUM CHLORIDE 20 MEQ/15ML (10%) PO SOLN
40.0000 meq | ORAL | Status: DC
  Administered 2018-03-09: 40 meq via ORAL
  Filled 2018-03-09: qty 30

## 2018-03-09 MED ORDER — MECLIZINE HCL 25 MG PO TABS
25.0000 mg | ORAL_TABLET | Freq: Two times a day (BID) | ORAL | Status: DC | PRN
  Filled 2018-03-09: qty 1

## 2018-03-09 MED ORDER — ACETAMINOPHEN 650 MG RE SUPP: 650.0000 mg | Freq: Four times a day (QID) | RECTAL | Status: DC | PRN

## 2018-03-09 MED ORDER — POTASSIUM CHLORIDE CRYS ER 20 MEQ PO TBCR
40.0000 meq | EXTENDED_RELEASE_TABLET | Freq: Once | ORAL | Status: AC
  Administered 2018-03-09: 40 meq via ORAL
  Filled 2018-03-09: qty 2

## 2018-03-09 MED ORDER — IBUPROFEN 400 MG PO TABS: 400.0000 mg | ORAL_TABLET | Freq: Four times a day (QID) | ORAL | Status: DC | PRN

## 2018-03-09 MED ORDER — LEVOTHYROXINE SODIUM 25 MCG PO TABS
25.0000 ug | ORAL_TABLET | Freq: Every day | ORAL | Status: DC
  Administered 2018-03-09 – 2018-03-10 (×2): 25 ug via ORAL
  Filled 2018-03-09 (×3): qty 1

## 2018-03-09 MED ORDER — ONDANSETRON HCL 4 MG/2ML IJ SOLN: 4.0000 mg | Freq: Four times a day (QID) | INTRAMUSCULAR | Status: DC | PRN

## 2018-03-09 NOTE — Progress Notes (Signed)
Messaged Dr Ree Kida to voice pt/family concerns and questions. She will order a repeat lab and pt will likely remain in observation until tomorrow or when K+ improves. Pt and family were updated by this RN.

## 2018-03-09 NOTE — ED Notes (Signed)
Admitting at bedside 

## 2018-03-09 NOTE — Progress Notes (Signed)
PROGRESS NOTE    CRISOL MUECKE  BLT:[id number] DOB: 05/25/1950 DOA: 03/08/2018 PCP: Everardo Beals, NP   Brief Narrative:  HPI on 03/09/2018 by Dr. Jennette Kettle SHALIN LINDERS is a 67 y.o. female with medical history significant of HTN, GERD, BLE edema for which she takes Lasix / HCTZ (not spironolactone as stated in EDP note).  Patient presents to the ED with ongoing nausea, decreased PO intake, generalized weakness over past 2 weeks, 15 lb wt loss over several months.  Saw PCP today who sent her in to ED for low potassium.  Intermittent low abd pains.  Episode of vaginal spotting last week, nothing active at this time.  Zofran helped symptoms at doctors office. Assessment & Plan   Admitted earlier today by Dr. Jennette Kettle. See H&P for details  Hypokalemia -Secondary to diuretics, GI losses from nausea and vomiting -potassium <2 on admission -Continue to replace potassium, pill, liquid, and IV forms -magnesium 1.8 (given replacement)  Essential hypertension -Lasix and HCTZ held  Nausea and vomiting -Unknown etiology -Seems to have improved as patient would like to eat -Continue antiemetics as needed -CT abdomen pelvis showed no acute intra-abdominal or pelvic pathology.  No obstruction or active inflammation.  Postmenopausal spotting -Occurred once per patient -CT as above -Pelvic US and endometrium.  Further evaluation with hysteroscopy recommended.  Right fundal exophytic fibroid. -Patient will need to follow-up with gynecology.  DVT Prophylaxis  lovenox  Code Status: Full  Family Communication: husband at bedside  Disposition Plan: observation, pending improvement in potassium levels. Home when stable  Consultants None  Procedures  Pelvic US  Antibiotics   Anti-infectives (From admission, onward)   None      Subjective:   Trysta Showman seen and examined today. Would like to eat. Denies current chest pain, shortness of breath,  abdominal pain, dizziness, headache.  Objective:   Vitals:   03/09/18 0406 03/09/18 1051 03/09/18 1052 03/09/18 1300  BP: (!) 123/58  118/72 124/66  Pulse: 72 77  70  Resp: 14 20  20   Temp:    (!) 97.4 F (36.3 C)  TempSrc:    Oral  SpO2: 98% 100%  99%  Weight:      Height:        Intake/Output Summary (Last 24 hours) at 03/09/2018 1418 Last data filed at 03/09/2018 1045 Gross per 24 hour  Intake 50 ml  Output -  Net 50 ml   Filed Weights   03/08/18 1722  Weight: 94.3 kg   No exam as patient was admitted today.   Data Reviewed: I have personally reviewed following labs and imaging studies  CBC: Recent Labs  Lab 03/08/18 1741  WBC 13.3*  HGB 15.3*  HCT 45.1  MCV 88.8  PLT 007   Basic Metabolic Panel: Recent Labs  Lab 03/08/18 1741 03/08/18 2314 03/09/18 0315 03/09/18 1102  NA 135 136 137  --   K 2.7* <2.0* 2.6* 2.8*  CL 78* 87* 90*  --   CO2 38* 34* 34*  --   GLUCOSE 118* 114* 112*  --   BUN 21 17 17   --   CREATININE 1.27* 1.08* 1.14*  --   CALCIUM 9.5 8.1* 8.3*  --   MG  --  1.8  --   --    GFR: Estimated Creatinine Clearance: 51.2 mL/min (A) (by C-G formula based on SCr of 1.14 mg/dL (H)). Liver Function Tests: No results for input(s): AST, ALT, ALKPHOS, BILITOT, PROT, ALBUMIN in the  last 168 hours. No results for input(s): LIPASE, AMYLASE in the last 168 hours. No results for input(s): AMMONIA in the last 168 hours. Coagulation Profile: No results for input(s): INR, PROTIME in the last 168 hours. Cardiac Enzymes: Recent Labs  Lab 03/08/18 2005  CKTOTAL 144   BNP (last 3 results) No results for input(s): PROBNP in the last 8760 hours. HbA1C: No results for input(s): HGBA1C in the last 72 hours. CBG: Recent Labs  Lab 03/08/18 1740 03/08/18 1920  GLUCAP 120* 99   Lipid Profile: No results for input(s): CHOL, HDL, LDLCALC, TRIG, CHOLHDL, LDLDIRECT in the last 72 hours. Thyroid Function Tests: No results for input(s): TSH, T4TOTAL,  FREET4, T3FREE, THYROIDAB in the last 72 hours. Anemia Panel: No results for input(s): VITAMINB12, FOLATE, FERRITIN, TIBC, IRON, RETICCTPCT in the last 72 hours. Urine analysis:    Component Value Date/Time   COLORURINE YELLOW 03/08/2018 2021   APPEARANCEUR CLOUDY (A) 03/08/2018 2021   LABSPEC 1.021 03/08/2018 2021   PHURINE 5.0 03/08/2018 2021   GLUCOSEU NEGATIVE 03/08/2018 2021   HGBUR MODERATE (A) 03/08/2018 2021   BILIRUBINUR NEGATIVE 03/08/2018 2021   KETONESUR 5 (A) 03/08/2018 2021   PROTEINUR NEGATIVE 03/08/2018 2021   UROBILINOGEN 1.0 08/03/2009 0930   NITRITE NEGATIVE 03/08/2018 2021   LEUKOCYTESUR NEGATIVE 03/08/2018 2021   Sepsis Labs: @LABRCNTIP (procalcitonin:4,lacticidven:4)  )No results found for this or any previous visit (from the past 240 hour(s)).    Radiology Studies: Ct Abdomen Pelvis W Contrast  Result Date: 03/08/2018 CLINICAL DATA:  67 year old female with generalized weakness and loss of appetite. EXAM: CT ABDOMEN AND PELVIS WITH CONTRAST TECHNIQUE: Multidetector CT imaging of the abdomen and pelvis was performed using the standard protocol following bolus administration of intravenous contrast. CONTRAST:  2mL OMNIPAQUE IOHEXOL 300 MG/ML  SOLN COMPARISON:  Some abdominal ultrasound dated 02/05/2016 FINDINGS: Lower chest: The visualized lung bases are clear. No intra-abdominal free air or free fluid. Hepatobiliary: The liver is unremarkable. No intrahepatic biliary ductal dilatation. The gallbladder is unremarkable. Pancreas: Unremarkable. No pancreatic ductal dilatation or surrounding inflammatory changes. Spleen: Normal in size without focal abnormality. Adrenals/Urinary Tract: The adrenal glands are unremarkable. There is no hydronephrosis on either side. There is symmetric enhancement and excretion of contrast by both kidneys. The visualized ureters and urinary bladder appear unremarkable. Stomach/Bowel: There is scattered sigmoid diverticula without active  inflammatory changes. There is no bowel obstruction or active inflammation. Normal appendix. Small hiatal hernia. Vascular/Lymphatic: The abdominal aorta and IVC are unremarkable. No portal venous gas. There is no adenopathy. Reproductive: The uterus is anteverted. Fundal fibroid noted. The ovaries are grossly unremarkable. Other: None Musculoskeletal: Multilevel facet arthropathy. No acute osseous pathology. IMPRESSION: 1. No acute intra-abdominal or pelvic pathology. No bowel obstruction or active inflammation. Normal appendix. 2. Scattered sigmoid diverticula without active inflammatory changes. 3. Fibroid uterus. Electronically Signed   By: Anner Crete M.D.   On: 03/08/2018 21:53   US Pelvic Complete With Transvaginal  Result Date: 03/09/2018 CLINICAL DATA:  67 year old female with fibroid uterus. Postmenopausal bleeding. EXAM: TRANSABDOMINAL AND TRANSVAGINAL ULTRASOUND OF PELVIS TECHNIQUE: Both transabdominal and transvaginal ultrasound examinations of the pelvis were performed. Transabdominal technique was performed for global imaging of the pelvis including uterus, ovaries, adnexal regions, and pelvic cul-de-sac. It was necessary to proceed with endovaginal exam following the transabdominal exam to visualize the endometrium and ovaries. COMPARISON:  CT of the abdomen pelvis dated 03/08/2018 FINDINGS: Uterus Measurements: 6.8 x 3.3 x 3.6 cm = volume: 43 mL. The uterus is  anteverted. There is a there is a 3.1 x 3.2 cm right fundal partially exophytic fibroid. Endometrium Thickness: The endometrium measures up to 7 mm in thickness. Further evaluation with hysteroscopy is recommended in this patient with postmenopausal bleeding. Right ovary Not visualized. Left ovary Not visualized. Other findings No abnormal free fluid. IMPRESSION: 1. Thickened endometrium. Further evaluation with hysteroscopy recommended. 2. Right fundal exophytic fibroid. Electronically Signed   By: Anner Crete M.D.   On:  03/09/2018 03:37     Scheduled Meds: . enoxaparin (LOVENOX) injection  40 mg Subcutaneous Daily  . feeding supplement (ENSURE ENLIVE)  237 mL Oral BID BM  . levothyroxine  25 mcg Oral QAC breakfast   Continuous Infusions:   LOS: 0 days   Time Spent in minutes   30 minutes  Breella Vanostrand D.O. on 03/09/2018 at 2:18 PM  Between 7am to 7pm - Please see pager noted on [email or web address]  After 7pm go to [email or web address]  And look for the night coverage person covering for me after hours  [facility] Office  [phone]

## 2018-03-09 NOTE — H&P (Signed)
History and Physical    Sabrina Collins [id number] DOB: 01-21-1951 DOA: 03/08/2018  PCP: Everardo Beals, NP  Patient coming from: Home  I have personally briefly reviewed patient's old medical records in [place]  Chief Complaint: Weakness, dehydration  HPI: Sabrina Collins is a 67 y.o. female with medical history significant of HTN, GERD, BLE edema for which she takes Lasix / HCTZ (not spironolactone as stated in EDP note).  Patient presents to the ED with ongoing nausea, decreased PO intake, generalized weakness over past 2 weeks, 15 lb wt loss over several months.  Saw PCP today who sent her in to ED for low potassium.  Intermittent low abd pains.  Episode of vaginal spotting last week, nothing active at this time.  Zofran helped symptoms at doctors office.   ED Course: K 2.7.  Given 60 meq PO K, 2L NS bolus.  Symptoms improved and patient now taking POs; however, repeat K is now < 2.0  Hospitalist asked to admit.   Review of Systems: As per HPI otherwise 10 point review of systems negative.   Past Medical History:  Diagnosis Date  . Hypertension     History reviewed. No pertinent surgical history.   reports that she has never smoked. She has never used smokeless tobacco. She reports that she does not drink alcohol or use drugs.  Allergies  Allergen Reactions  . Codeine Rash  . Penicillins Rash    Has patient had a PCN reaction causing immediate rash, facial/tongue/throat swelling, SOB or lightheadedness with hypotension: Yes Has patient had a PCN reaction causing severe rash involving mucus membranes or skin necrosis: Unk Has patient had a PCN reaction that required hospitalization: No Has patient had a PCN reaction occurring within the last 10 years: No If all of the above answers are "NO", then may proceed with Cephalosporin use.     Family History  Problem Relation Age of Onset  . Lung cancer Mother   . Colon cancer Father   . Ulcers  Father   . Asthma Daughter      Prior to Admission medications   Medication Sig Start Date End Date Taking? Authorizing Provider  Albuterol Sulfate (PROAIR HFA IN) Inhale 2 puffs into the lungs every 6 (six) hours as needed (for shortness of breath or wheezing).    Yes [provider]  chlorpheniramine-HYDROcodone (TUSSIONEX) 10-8 MG/5ML SUER Take 5 mLs by mouth every 12 (twelve) hours as needed for cough. 12/24/17  Yes [provider]  furosemide (LASIX) 40 MG tablet Take 40 mg by mouth daily.   Yes [provider]  hydrochlorothiazide (HYDRODIURIL) 25 MG tablet Take 25 mg by mouth daily.   Yes [provider]  ibuprofen (ADVIL,MOTRIN) 200 MG tablet Take 400 mg by mouth every 6 (six) hours as needed for headache or mild pain.   Yes [provider]  levothyroxine (SYNTHROID, LEVOTHROID) 25 MCG tablet Take 25 mcg by mouth daily before breakfast.  02/05/18  Yes [provider]  meclizine (ANTIVERT) 25 MG tablet Take 25 mg by mouth every 12 (twelve) hours as needed for dizziness.    Yes [provider]  traMADol (ULTRAM) 50 MG tablet Take 50 mg by mouth every 6 (six) hours as needed (fo pain).  12/10/17  Yes [provider]  ondansetron (ZOFRAN ODT) 4 MG disintegrating tablet Take 1 tablet (4 mg total) by mouth every 8 (eight) hours as needed for nausea or vomiting. 03/08/18   Carlisle Cater, PA-C  potassium chloride SA (K-DUR,KLOR-CON) 20 MEQ tablet Take 1 tablet (20 mEq total) by mouth 2 (two) times daily. 03/08/18   Carlisle Cater, PA-C    Physical Exam: Vitals:   03/08/18 1718 03/08/18 1722 03/08/18 1911  BP: (!) 151/103  (!) 154/71  Pulse: 83  86  Resp: 18  (!) 22  Temp: 98.4 F (36.9 C)    TempSrc: Oral    SpO2: 99%  98%  Weight:  94.3 kg   Height:  5' (1.524 m)     Constitutional: NAD, calm, comfortable Eyes: PERRL, lids and conjunctivae normal ENMT: Mucous membranes are moist. Posterior pharynx clear of any  exudate or lesions.Normal dentition.  Neck: normal, supple, no masses, no thyromegaly Respiratory: clear to auscultation bilaterally, no wheezing, no crackles. Normal respiratory effort. No accessory muscle use.  Cardiovascular: Regular rate and rhythm, no murmurs / rubs / gallops. No extremity edema. 2+ pedal pulses. No carotid bruits.  Abdomen: no tenderness, no masses palpated. No hepatosplenomegaly. Bowel sounds positive.  Musculoskeletal: no clubbing / cyanosis. No joint deformity upper and lower extremities. Good ROM, no contractures. Normal muscle tone.  Skin: no rashes, lesions, ulcers. No induration Neurologic: CN 2-12 grossly intact. Sensation intact, DTR normal. Strength 5/5 in all 4.  Psychiatric: Normal judgment and insight. Alert and oriented x 3. Normal mood.    Labs on Admission: I have personally reviewed following labs and imaging studies  CBC: Recent Labs  Lab 03/08/18 1741  WBC 13.3*  HGB 15.3*  HCT 45.1  MCV 88.8  PLT 102   Basic Metabolic Panel: Recent Labs  Lab 03/08/18 1741 03/08/18 2314  NA 135 136  K 2.7* <2.0*  CL 78* 87*  CO2 38* 34*  GLUCOSE 118* 114*  BUN 21 17  CREATININE 1.27* 1.08*  CALCIUM 9.5 8.1*  MG  --  1.8   GFR: Estimated Creatinine Clearance: 54 mL/min (A) (by C-G formula based on SCr of 1.08 mg/dL (H)). Liver Function Tests: No results for input(s): AST, ALT, ALKPHOS, BILITOT, PROT, ALBUMIN in the last 168 hours. No results for input(s): LIPASE, AMYLASE in the last 168 hours. No results for input(s): AMMONIA in the last 168 hours. Coagulation Profile: No results for input(s): INR, PROTIME in the last 168 hours. Cardiac Enzymes: Recent Labs  Lab 03/08/18 2005  CKTOTAL 144   BNP (last 3 results) No results for input(s): PROBNP in the last 8760 hours. HbA1C: No results for input(s): HGBA1C in the last 72 hours. CBG: Recent Labs  Lab 03/08/18 1740 03/08/18 1920  GLUCAP 120* 99   Lipid Profile: No results for  input(s): CHOL, HDL, LDLCALC, TRIG, CHOLHDL, LDLDIRECT in the last 72 hours. Thyroid Function Tests: No results for input(s): TSH, T4TOTAL, FREET4, T3FREE, THYROIDAB in the last 72 hours. Anemia Panel: No results for input(s): VITAMINB12, FOLATE, FERRITIN, TIBC, IRON, RETICCTPCT in the last 72 hours. Urine analysis:    Component Value Date/Time   COLORURINE YELLOW 03/08/2018 2021   APPEARANCEUR CLOUDY (A) 03/08/2018 2021   LABSPEC 1.021 03/08/2018 2021   PHURINE 5.0 03/08/2018 2021   GLUCOSEU NEGATIVE 03/08/2018 2021   HGBUR MODERATE (A) 03/08/2018 2021   BILIRUBINUR NEGATIVE 03/08/2018 2021   KETONESUR 5 (A) 03/08/2018 2021   PROTEINUR NEGATIVE 03/08/2018 2021   UROBILINOGEN 1.0 08/03/2009 0930   NITRITE NEGATIVE 03/08/2018 2021   LEUKOCYTESUR NEGATIVE 03/08/2018 2021    Radiological Exams on Admission: Ct Abdomen Pelvis W Contrast  Result Date: 03/08/2018 CLINICAL DATA:  67 year old female with generalized weakness  and loss of appetite. EXAM: CT ABDOMEN AND PELVIS WITH CONTRAST TECHNIQUE: Multidetector CT imaging of the abdomen and pelvis was performed using the standard protocol following bolus administration of intravenous contrast. CONTRAST:  92mL OMNIPAQUE IOHEXOL 300 MG/ML  SOLN COMPARISON:  Some abdominal ultrasound dated 02/05/2016 FINDINGS: Lower chest: The visualized lung bases are clear. No intra-abdominal free air or free fluid. Hepatobiliary: The liver is unremarkable. No intrahepatic biliary ductal dilatation. The gallbladder is unremarkable. Pancreas: Unremarkable. No pancreatic ductal dilatation or surrounding inflammatory changes. Spleen: Normal in size without focal abnormality. Adrenals/Urinary Tract: The adrenal glands are unremarkable. There is no hydronephrosis on either side. There is symmetric enhancement and excretion of contrast by both kidneys. The visualized ureters and urinary bladder appear unremarkable. Stomach/Bowel: There is scattered sigmoid diverticula  without active inflammatory changes. There is no bowel obstruction or active inflammation. Normal appendix. Small hiatal hernia. Vascular/Lymphatic: The abdominal aorta and IVC are unremarkable. No portal venous gas. There is no adenopathy. Reproductive: The uterus is anteverted. Fundal fibroid noted. The ovaries are grossly unremarkable. Other: None Musculoskeletal: Multilevel facet arthropathy. No acute osseous pathology. IMPRESSION: 1. No acute intra-abdominal or pelvic pathology. No bowel obstruction or active inflammation. Normal appendix. 2. Scattered sigmoid diverticula without active inflammatory changes. 3. Fibroid uterus. Electronically Signed   By: Anner Crete M.D.   On: 03/08/2018 21:53    EKG: Independently reviewed.  Assessment/Plan Principal Problem:   Hypokalemia Active Problems:   Essential hypertension   N&V (nausea and vomiting)   Post-menopausal bleeding    1. Hypokalemia -  1. Probably due to being on Lasix, HCTZ, no K replacement 2. Not helped by N/V 3. Replace K 4. Tele monitor 5. Holding lasix and HCTZ 2. HTN - 1. Holding lasix and HCTZ 2. Will want to adjust meds to a regimen that isnt so potassium wasting 3. N/V - 1. Unclear cause, dehydration perhaps? 2. Seems better after IVF 3. CT neg 4. Continue to monitor for recurrent symptoms 5. UDS 4. Post-menopausal spotting 1. CT abd / pelvis neg 2. Pelvic US ordered  DVT prophylaxis: Lovenox Code Status: Full Family Communication: Husband at bedside Disposition Plan: Home after admit Consults called: None Admission status: Place in obs   [place], [place] [facility] Pager [phone] Only works nights!  If 7AM-7PM, please contact the primary day team physician taking care of patient  [email or web address] Password [email or web address]  03/09/2018, 1:33 AM

## 2018-03-09 NOTE — ED Notes (Signed)
Regular diet lunch tray ordered at 1215.

## 2018-03-09 NOTE — ED Notes (Signed)
Verified breakfast has been ordered.

## 2018-03-10 DIAGNOSIS — N950 Postmenopausal bleeding: Secondary | ICD-10-CM

## 2018-03-10 DIAGNOSIS — I10 Essential (primary) hypertension: Secondary | ICD-10-CM | POA: Diagnosis not present

## 2018-03-10 DIAGNOSIS — R112 Nausea with vomiting, unspecified: Secondary | ICD-10-CM | POA: Diagnosis not present

## 2018-03-10 DIAGNOSIS — E876 Hypokalemia: Secondary | ICD-10-CM | POA: Diagnosis not present

## 2018-03-10 LAB — BASIC METABOLIC PANEL
Anion gap: 9 (ref 5–15)
BUN: 12 mg/dL (ref 8–23)
CO2: 32 mmol/L (ref 22–32)
Calcium: 7.9 mg/dL — ABNORMAL LOW (ref 8.9–10.3)
Chloride: 100 mmol/L (ref 98–111)
Creatinine, Ser: 0.94 mg/dL (ref 0.44–1.00)
GFR calc Af Amer: >60 mL/min (ref >60)
GFR calc non Af Amer: >60 mL/min (ref >60)
Glucose, Bld: 110 mg/dL — ABNORMAL HIGH (ref 70–99)
Potassium: 3.3 mmol/L — ABNORMAL LOW (ref 3.5–5.1)
Sodium: 141 mmol/L (ref 135–145)

## 2018-03-10 LAB — CBC
HCT: 37.6 % (ref 36.0–46.0)
Hemoglobin: 12 g/dL (ref 12.0–15.0)
MCH: 29.6 pg (ref 26.0–34.0)
MCHC: 31.9 g/dL (ref 30.0–36.0)
MCV: 92.6 fL (ref 80.0–100.0)
PLATELETS: 261 10*3/uL (ref 150–400)
RBC: 4.06 MIL/uL (ref 3.87–5.11)
RDW: 13.2 % (ref 11.5–15.5)
WBC: 7.7 10*3/uL (ref 4.0–10.5)
nRBC: 0 % (ref 0.0–0.2)

## 2018-03-10 LAB — POTASSIUM: Potassium: 3.5 mmol/L (ref 3.5–5.1)

## 2018-03-10 MED ORDER — ENSURE ENLIVE PO LIQD: 1.0000 | Freq: Two times a day (BID) | ORAL | 0 refills | Status: DC

## 2018-03-10 MED ORDER — POTASSIUM CHLORIDE CRYS ER 20 MEQ PO TBCR: 20.0000 meq | EXTENDED_RELEASE_TABLET | Freq: Two times a day (BID) | ORAL | 0 refills | Status: AC

## 2018-03-10 MED ORDER — ONDANSETRON 4 MG PO TBDP: 4.0000 mg | ORAL_TABLET | Freq: Three times a day (TID) | ORAL | 0 refills | Status: DC | PRN

## 2018-03-10 MED ORDER — POTASSIUM CHLORIDE 20 MEQ/15ML (10%) PO SOLN
40.0000 meq | ORAL | Status: AC
  Administered 2018-03-10 (×2): 40 meq via ORAL
  Filled 2018-03-10 (×2): qty 30

## 2018-03-10 NOTE — Care Management Obs Status (Signed)
[facility] NOTIFICATION   Patient Details  Name: Sabrina Collins MRN: [id number] Date of Birth: November 16, 1950   Medicare Observation Status Notification Given:  Yes    Midge Minium RN, BSN, NCM-BC, ACM-RN [phone] 03/10/2018, 12:07 PM

## 2018-03-10 NOTE — Progress Notes (Signed)
Pt and husband given all discharge orders and verbalized understanding of all.  Pt is aware of where to pick up her meds.  All belongings with pt and discharged home with husband.

## 2018-03-10 NOTE — Discharge Summary (Signed)
Physician Discharge Summary  Sabrina Collins [id number]:[id number] DOB: 31-Jan-1951 DOA: 03/08/2018  PCP: Everardo Beals, NP  Admit date: 03/08/2018 Discharge date: 03/10/2018  Time spent: 45 minutes  Recommendations for Outpatient Follow-up:  Patient will be discharged to home.  Patient will need to follow up with primary care provider within one week of discharge, repeat BMP, discuss blood pressure management and gastroenterology referral. Follow up with gynecology. Patient should continue medications as prescribed.  Patient should follow a heart healthy diet.   Discharge Diagnoses:  Hypokalemia Essential hypertension Nausea and vomiting Postmenopausal spotting  Discharge Condition: Stable   Diet recommendation: heart healthy  Filed Weights   03/08/18 1722  Weight: 94.3 kg    History of present illness:  on 03/09/2018 by Dr. Desmond Dike a 67 y.o.femalewith medical history significant ofHTN, GERD, BLE edema for which she takes Lasix / HCTZ (not spironolactone as stated in EDP note).  Patient presents to the ED with ongoing nausea, decreased PO intake, generalized weakness over past 2 weeks, 15 lb wt loss over several months.  Saw PCP today who sent her in to ED for low potassium.  Intermittent low abd pains. Episode of vaginal spotting last week, nothing active at this time.  Zofran helped symptoms at doctors office.  Hospital Course:  Hypokalemia -Secondary to diuretics, GI losses from nausea and vomiting -potassium <2 on admission, currently 3.5 after replacement  -magnesium 1.8 (given replacement) -repeat BMP in one week -continue supplementation at discharge  Essential hypertension -Lasix and HCTZ held- discuss PCP the need to resume as BP has been stable without these 2 diuretics  Nausea and vomiting -Unknown etiology -Seems to have improved as patient would like to eat -Continue antiemetics as needed -CT abdomen pelvis showed  no acute intra-abdominal or pelvic pathology.  No obstruction or active inflammation.  Postmenopausal spotting -Occurred once per patient -CT as above -Pelvic US and endometrium.  Further evaluation with hysteroscopy recommended.  Right fundal exophytic fibroid. -Patient will need to follow-up with gynecology  Consultants None  Procedures  Pelvic US  Discharge Exam: Vitals:   03/09/18 1640 03/10/18 0600  BP: 110/73 125/63  Pulse: 84 70  Resp: 18 16  Temp: 97.7 F (36.5 C) 97.8 F (36.6 C)  SpO2: 97% 96%   Feeling much better.  Denies any current or further nausea or vomiting.  Denies any abdominal pain.  Denies chest pain, shortness of breath, dizziness or headache.  Wanting to go home today.   General: Well developed, well nourished, NAD, appears stated age  67: NCAT,mucous membranes moist.  Neck: Supple  Cardiovascular: S1 S2 auscultated, no murmur, RRR  Respiratory: Clear to auscultation bilaterally   Abdomen: Soft, obese, nontender, nondistended, + bowel sounds  Extremities: warm dry without cyanosis clubbing or edema  Neuro: AAOx3, nonfocal  Psych: Normal affect and demeanor, pleasant  Discharge Instructions Discharge Instructions    Discharge instructions   Complete by:  As directed    Patient will be discharged to home.  Patient will need to follow up with primary care provider within one week of discharge, repeat BMP, discuss blood pressure management and gastroenterology referral. Follow up with gynecology. Patient should continue medications as prescribed.  Patient should follow a heart healthy diet.     Allergies as of 03/10/2018      Reactions   Codeine Rash   Penicillins Rash   Has patient had a PCN reaction causing immediate rash, facial/tongue/throat swelling, SOB or lightheadedness with hypotension: Yes Has  patient had a PCN reaction causing severe rash involving mucus membranes or skin necrosis: Unk Has patient had a PCN reaction that  required hospitalization: No Has patient had a PCN reaction occurring within the last 10 years: No If all of the above answers are "NO", then may proceed with Cephalosporin use.      Medication List    STOP taking these medications   famotidine 20 MG tablet Commonly known as:  PEPCID     TAKE these medications   chlorpheniramine-HYDROcodone 10-8 MG/5ML Suer Commonly known as:  TUSSIONEX Take 5 mLs by mouth every 12 (twelve) hours as needed for cough.   feeding supplement (ENSURE ENLIVE) Liqd Take 237 mLs by mouth 2 (two) times daily between meals.   furosemide 40 MG tablet Commonly known as:  LASIX Take 40 mg by mouth daily.   hydrochlorothiazide 25 MG tablet Commonly known as:  HYDRODIURIL Take 25 mg by mouth daily.   ibuprofen 200 MG tablet Commonly known as:  ADVIL,MOTRIN Take 400 mg by mouth every 6 (six) hours as needed for headache or mild pain.   levothyroxine 25 MCG tablet Commonly known as:  SYNTHROID, LEVOTHROID Take 25 mcg by mouth daily before breakfast.   meclizine 25 MG tablet Commonly known as:  ANTIVERT Take 25 mg by mouth every 12 (twelve) hours as needed for dizziness.   ondansetron 4 MG disintegrating tablet Commonly known as:  ZOFRAN ODT Take 1 tablet (4 mg total) by mouth every 8 (eight) hours as needed for nausea or vomiting.   potassium chloride SA 20 MEQ tablet Commonly known as:  K-DUR,KLOR-CON Take 1 tablet (20 mEq total) by mouth 2 (two) times daily.   PROAIR HFA IN Inhale 2 puffs into the lungs every 6 (six) hours as needed (for shortness of breath or wheezing).   traMADol 50 MG tablet Commonly known as:  ULTRAM Take 50 mg by mouth every 6 (six) hours as needed (fo pain).      Allergies  Allergen Reactions  . Codeine Rash  . Penicillins Rash    Has patient had a PCN reaction causing immediate rash, facial/tongue/throat swelling, SOB or lightheadedness with hypotension: Yes Has patient had a PCN reaction causing severe rash  involving mucus membranes or skin necrosis: Unk Has patient had a PCN reaction that required hospitalization: No Has patient had a PCN reaction occurring within the last 10 years: No If all of the above answers are "NO", then may proceed with Cephalosporin use.    Follow-up Information    Everardo Beals, NP. Schedule an appointment as soon as possible for a visit in 1 week(s).   Why:  Hospital follow up Discuss blood pressure management  Contact information: [facility] [place]  [id number] [phone]            The results of significant diagnostics from this hospitalization (including imaging, microbiology, ancillary and laboratory) are listed below for reference.    Significant Diagnostic Studies: Ct Abdomen Pelvis W Contrast  Result Date: 03/08/2018 CLINICAL DATA:  67 year old female with generalized weakness and loss of appetite. EXAM: CT ABDOMEN AND PELVIS WITH CONTRAST TECHNIQUE: Multidetector CT imaging of the abdomen and pelvis was performed using the standard protocol following bolus administration of intravenous contrast. CONTRAST:  74mL OMNIPAQUE IOHEXOL 300 MG/ML  SOLN COMPARISON:  Some abdominal ultrasound dated 02/05/2016 FINDINGS: Lower chest: The visualized lung bases are clear. No intra-abdominal free air or free fluid. Hepatobiliary: The liver is unremarkable. No intrahepatic  biliary ductal dilatation. The gallbladder is unremarkable. Pancreas: Unremarkable. No pancreatic ductal dilatation or surrounding inflammatory changes. Spleen: Normal in size without focal abnormality. Adrenals/Urinary Tract: The adrenal glands are unremarkable. There is no hydronephrosis on either side. There is symmetric enhancement and excretion of contrast by both kidneys. The visualized ureters and urinary bladder appear unremarkable. Stomach/Bowel: There is scattered sigmoid diverticula without active inflammatory changes. There is no bowel  obstruction or active inflammation. Normal appendix. Small hiatal hernia. Vascular/Lymphatic: The abdominal aorta and IVC are unremarkable. No portal venous gas. There is no adenopathy. Reproductive: The uterus is anteverted. Fundal fibroid noted. The ovaries are grossly unremarkable. Other: None Musculoskeletal: Multilevel facet arthropathy. No acute osseous pathology. IMPRESSION: 1. No acute intra-abdominal or pelvic pathology. No bowel obstruction or active inflammation. Normal appendix. 2. Scattered sigmoid diverticula without active inflammatory changes. 3. Fibroid uterus. Electronically Signed   By: Anner Crete M.D.   On: 03/08/2018 21:53   US Pelvic Complete With Transvaginal  Result Date: 03/09/2018 CLINICAL DATA:  67 year old female with fibroid uterus. Postmenopausal bleeding. EXAM: TRANSABDOMINAL AND TRANSVAGINAL ULTRASOUND OF PELVIS TECHNIQUE: Both transabdominal and transvaginal ultrasound examinations of the pelvis were performed. Transabdominal technique was performed for global imaging of the pelvis including uterus, ovaries, adnexal regions, and pelvic cul-de-sac. It was necessary to proceed with endovaginal exam following the transabdominal exam to visualize the endometrium and ovaries. COMPARISON:  CT of the abdomen pelvis dated 03/08/2018 FINDINGS: Uterus Measurements: 6.8 x 3.3 x 3.6 cm = volume: 43 mL. The uterus is anteverted. There is a there is a 3.1 x 3.2 cm right fundal partially exophytic fibroid. Endometrium Thickness: The endometrium measures up to 7 mm in thickness. Further evaluation with hysteroscopy is recommended in this patient with postmenopausal bleeding. Right ovary Not visualized. Left ovary Not visualized. Other findings No abnormal free fluid. IMPRESSION: 1. Thickened endometrium. Further evaluation with hysteroscopy recommended. 2. Right fundal exophytic fibroid. Electronically Signed   By: Anner Crete M.D.   On: 03/09/2018 03:37    Microbiology: No  results found for this or any previous visit (from the past 240 hour(s)).   Labs: Basic Metabolic Panel: Recent Labs  Lab 03/08/18 1741 03/08/18 2314 03/09/18 0315 03/09/18 1102 03/09/18 1622 03/10/18 0300 03/10/18 1253  NA 135 136 137  --   --  141  --   K 2.7* <2.0* 2.6* 2.8* 3.2* 3.3* 3.5  CL 78* 87* 90*  --   --  100  --   CO2 38* 34* 34*  --   --  32  --   GLUCOSE 118* 114* 112*  --   --  110*  --   BUN 21 17 17   --   --  12  --   CREATININE 1.27* 1.08* 1.14*  --   --  0.94  --   CALCIUM 9.5 8.1* 8.3*  --   --  7.9*  --   MG  --  1.8  --   --   --   --   --    Liver Function Tests: No results for input(s): AST, ALT, ALKPHOS, BILITOT, PROT, ALBUMIN in the last 168 hours. No results for input(s): LIPASE, AMYLASE in the last 168 hours. No results for input(s): AMMONIA in the last 168 hours. CBC: Recent Labs  Lab 03/08/18 1741 03/10/18 0300  WBC 13.3* 7.7  HGB 15.3* 12.0  HCT 45.1 37.6  MCV 88.8 92.6  PLT 356 261   Cardiac Enzymes: Recent Labs  Lab 03/08/18  2005  CKTOTAL 144   BNP: BNP (last 3 results) No results for input(s): BNP in the last 8760 hours.  ProBNP (last 3 results) No results for input(s): PROBNP in the last 8760 hours.  CBG: Recent Labs  Lab 03/08/18 1740 03/08/18 1920  GLUCAP 120* 99       Signed:  Camala Talwar  [facility] 03/10/2018, 2:14 PM

## 2019-06-16 ENCOUNTER — Ambulatory Visit: Payer: Medicare Other | Attending: Internal Medicine

## 2019-06-16 DIAGNOSIS — Z23 Encounter for immunization: Secondary | ICD-10-CM

## 2019-06-16 NOTE — Progress Notes (Signed)
   Covid-19 Vaccination Clinic  Name:  Sabrina Collins    MRN: [id number] DOB: [birth year]  06/16/2019  Sabrina Collins was observed post Covid-19 immunization for 15 minutes without incident. She was provided with Vaccine Information Sheet and instruction to access the V-Safe system.   Sabrina Collins was instructed to call 911 with any severe reactions post vaccine: Marland Kitchen Difficulty breathing  . Swelling of face and throat  . A fast heartbeat  . A bad rash all over body  . Dizziness and weakness   Immunizations Administered    Name Date Dose VIS Date Route   Pfizer COVID-19 Vaccine 06/16/2019  8:08 AM 0.3 mL 03/10/2019 Intramuscular   Manufacturer: [place]   Lot: [id number]   [place]: [id number]

## 2019-07-11 ENCOUNTER — Ambulatory Visit: Payer: Medicare Other | Attending: Internal Medicine

## 2019-07-11 DIAGNOSIS — Z23 Encounter for immunization: Secondary | ICD-10-CM

## 2019-07-11 NOTE — Progress Notes (Signed)
   Covid-19 Vaccination Clinic  Name:  Sabrina Collins    MRN: [id number] DOB: 1950/09/01  07/11/2019  Sabrina Collins was observed post Covid-19 immunization for 15 minutes without incident. She was provided with Vaccine Information Sheet and instruction to access the V-Safe system.   Sabrina Collins was instructed to call 911 with any severe reactions post vaccine: Marland Kitchen Difficulty breathing  . Swelling of face and throat  . A fast heartbeat  . A bad rash all over body  . Dizziness and weakness   Immunizations Administered    Name Date Dose VIS Date Route   Pfizer COVID-19 Vaccine 07/11/2019  8:57 AM 0.3 mL 03/10/2019 Intramuscular   Manufacturer: [place]   Lot: [id number]   [place]: [id number]

## 2020-03-13 DIAGNOSIS — N882 Stricture and stenosis of cervix uteri: Secondary | ICD-10-CM | POA: Insufficient documentation

## 2020-03-13 DIAGNOSIS — E785 Hyperlipidemia, unspecified: Secondary | ICD-10-CM | POA: Insufficient documentation

## 2020-03-13 DIAGNOSIS — D259 Leiomyoma of uterus, unspecified: Secondary | ICD-10-CM | POA: Insufficient documentation

## 2020-03-13 DIAGNOSIS — J45909 Unspecified asthma, uncomplicated: Secondary | ICD-10-CM | POA: Insufficient documentation

## 2020-03-13 DIAGNOSIS — K219 Gastro-esophageal reflux disease without esophagitis: Secondary | ICD-10-CM | POA: Insufficient documentation

## 2020-03-14 ENCOUNTER — Inpatient Hospital Stay: Admit: 2020-03-14 | Discharge: 2020-03-14 | Disposition: A | Discharge status: Home / Self Care | Payer: Self-pay

## 2020-03-27 ENCOUNTER — Ambulatory Visit (HOSPITAL_COMMUNITY)
Admission: EM | Admit: 2020-03-27 | Discharge: 2020-03-27 | Disposition: A | Discharge status: Home / Self Care | Payer: Medicare Other | Attending: Family Medicine | Admitting: Family Medicine

## 2020-03-27 ENCOUNTER — Encounter (HOSPITAL_COMMUNITY): Payer: Self-pay

## 2020-03-27 DIAGNOSIS — Z20822 Contact with and (suspected) exposure to covid-19: Secondary | ICD-10-CM

## 2020-03-27 DIAGNOSIS — U071 COVID-19: Secondary | ICD-10-CM | POA: Diagnosis not present

## 2020-03-27 DIAGNOSIS — R059 Cough, unspecified: Secondary | ICD-10-CM | POA: Insufficient documentation

## 2020-03-27 MED ORDER — BENZONATATE 100 MG PO CAPS: ORAL_CAPSULE | ORAL | 0 refills | Status: DC

## 2020-03-27 NOTE — ED Notes (Signed)
Patient waiting in car with grandchild until mother can return

## 2020-03-27 NOTE — ED Triage Notes (Signed)
Pt presents with cough, nasal congestion, chills, diarrhea x 2 weeks, after the Flu shot and Tdap. Pt grandson tested positive for COVID today.   States she started developing a itchy rash , and was told by her PCP to follow up by a Dermatologist.

## 2020-03-27 NOTE — Discharge Instructions (Signed)
You have been tested for COVID-19 today. °If your test returns positive, you will receive a phone call from [place] regarding your results. °Negative test results are not called. °Both positive and negative results area always visible on MyChart. °If you do not have a MyChart account, sign up instructions are provided in your discharge papers. °Please do not hesitate to contact us should you have questions or concerns. ° °

## 2020-03-27 NOTE — ED Notes (Signed)
Patient called at number listed with no answer.

## 2020-03-28 LAB — SARS CORONAVIRUS 2 (TAT 6-24 HRS): SARS Coronavirus 2: POSITIVE — AB

## 2020-03-30 NOTE — ED Provider Notes (Signed)
[place]   [id number] 03/27/20 Arrival Time: [id number]  ASSESSMENT & PLAN:  1. Exposure to COVID-19 virus   2. Cough     COVID-19 testing sent. OTC symptom care as needed.  Meds ordered this encounter  Medications  . benzonatate (TESSALON) 100 MG capsule    Sig: Take 1 capsule by mouth every 8 (eight) hours for cough.    Dispense:  21 capsule    Refill:  0     Follow-up Information    Everardo Beals, NP.   Why: As needed. Contact information: [place] [address] [phone]               Reviewed expectations re: course of current medical issues. Questions answered. Outlined signs and symptoms indicating need for more acute intervention. Understanding verbalized. After Visit Summary given.   SUBJECTIVE: History from: patient. Sabrina Collins is a 70 y.o. female who presents with worries regarding COVID-19. Known COVID-19 contact: grandson. Recent travel: none. Reports: nasal congestion, chills, loose stools (1-2 weeks). Denies: fever and difficulty breathing. Normal PO intake without n/v/d.    OBJECTIVE:  Vitals:   03/27/20 1946  BP: 139/75  Pulse: 81  Resp: 20  Temp: 98.2 F (36.8 C)  TempSrc: Oral  SpO2: 97%    General appearance: alert; no distress Eyes: PERRLA; EOMI; conjunctiva normal HENT: [place]; AT; without nasal congestion Neck: supple  Lungs: speaks full sentences without difficulty; unlabored Extremities: no edema Skin: warm and dry Neurologic: normal gait Psychological: alert and cooperative; normal mood and affect    Allergies  Allergen Reactions  . Codeine Rash  . Penicillins Rash    Has patient had a PCN reaction causing immediate rash, facial/tongue/throat swelling, SOB or lightheadedness with hypotension: Yes Has patient had a PCN reaction causing severe rash involving mucus membranes or skin necrosis: Unk Has patient had a PCN reaction that required hospitalization: No Has patient had a  PCN reaction occurring within the last 10 years: No If all of the above answers are "NO", then may proceed with Cephalosporin use.     Past Medical History:  Diagnosis Date  . Asthma    PER PATIENT   . Hypertension   . Hypokalemia 02/2018   Social History   Socioeconomic History  . Marital status: Married    Spouse name: Not on file  . Number of children: Not on file  . Years of education: Not on file  . Highest education level: Not on file  Occupational History  . Not on file  Tobacco Use  . Smoking status: Never Smoker  . Smokeless tobacco: Never Used  Vaping Use  . Vaping Use: Never used  Substance and Sexual Activity  . Alcohol use: No  . Drug use: No  . Sexual activity: Not on file  Other Topics Concern  . Not on file  Social History Narrative  . Not on file   Social Determinants of Health   Financial Resource Strain: Not on file  Food Insecurity: Not on file  Transportation Needs: Not on file  Physical Activity: Not on file  Stress: Not on file  Social Connections: Not on file  Intimate Partner Violence: Not on file   Family History  Problem Relation Age of Onset  . Lung cancer Mother   . Colon cancer Father   . Ulcers Father   . Asthma Daughter    Past Surgical History:  Procedure Laterality Date  . TONSILLECTOMY    . TUBAL  Mauricia Area, MD 03/30/20 [id number]

## 2020-04-06 ENCOUNTER — Ambulatory Visit: Payer: BLUE CROSS/BLUE SHIELD

## 2020-11-23 IMAGING — US US PELVIS COMPLETE TRANSABD/TRANSVAG
1 series · 14 of 25 positions shown · non-contrast
Comparison: CT of the abdomen pelvis dated 03/08/2018

CLINICAL DATA: 66-year-old female with fibroid uterus.
Postmenopausal bleeding.

EXAM:
TRANSABDOMINAL AND TRANSVAGINAL ULTRASOUND OF PELVIS
TECHNIQUE: Both transabdominal and transvaginal ultrasound examinations of the
pelvis were performed. Transabdominal technique was performed for
global imaging of the pelvis including uterus, ovaries, adnexal
regions, and pelvic cul-de-sac. It was necessary to proceed with
endovaginal exam following the transabdominal exam to visualize the
endometrium and ovaries..

[Series 1: us pelvis complete transabd/transvag · 0.23mm/px · 14 of 27 slices shown]
[im 1/27]
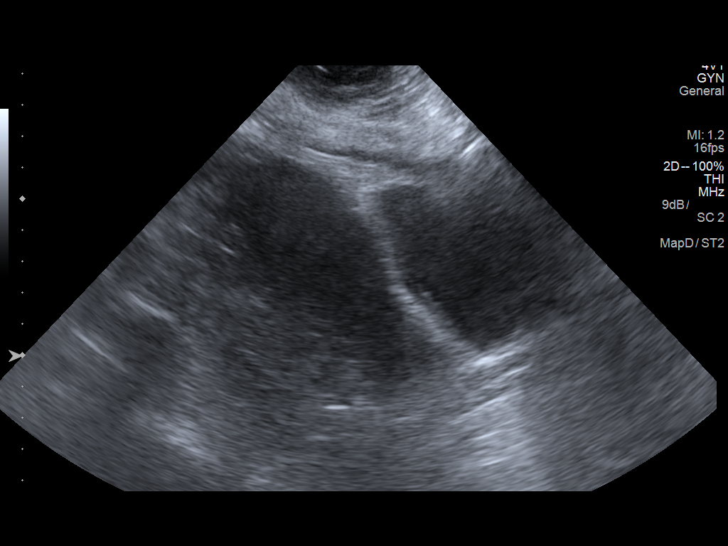
[im 3/27]
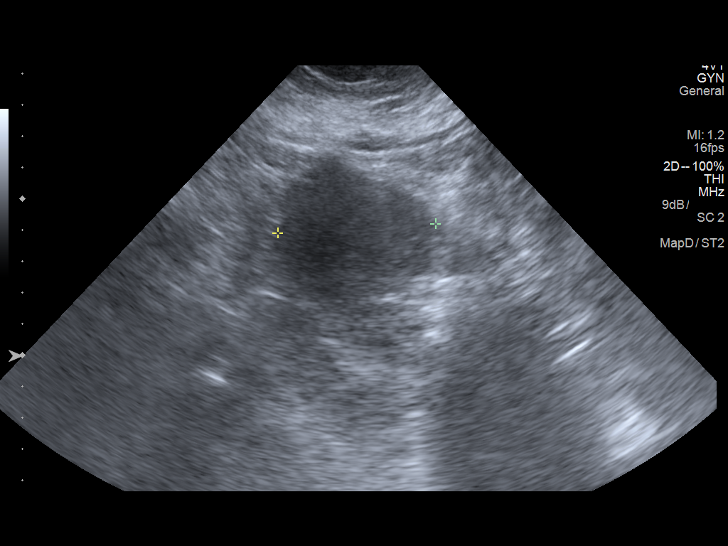
[im 5/27]
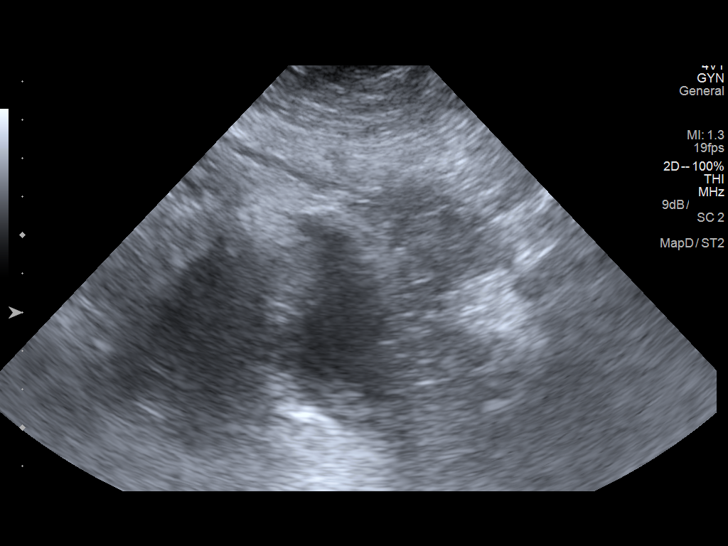
[im 7/27]
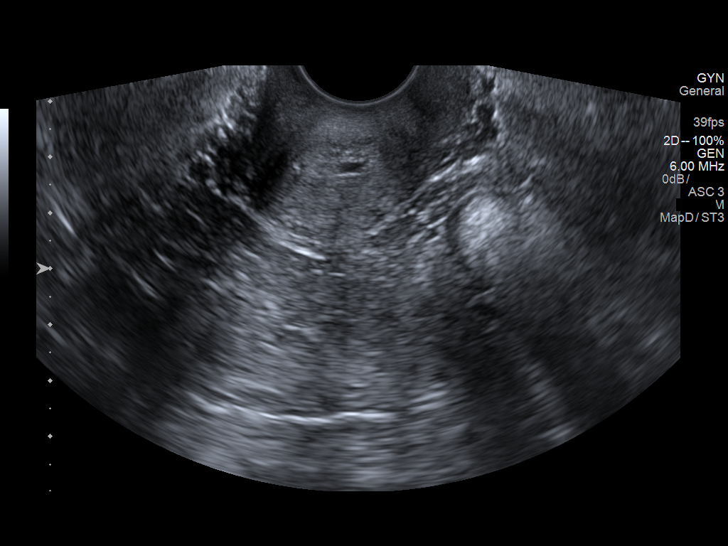
[im 9/27]
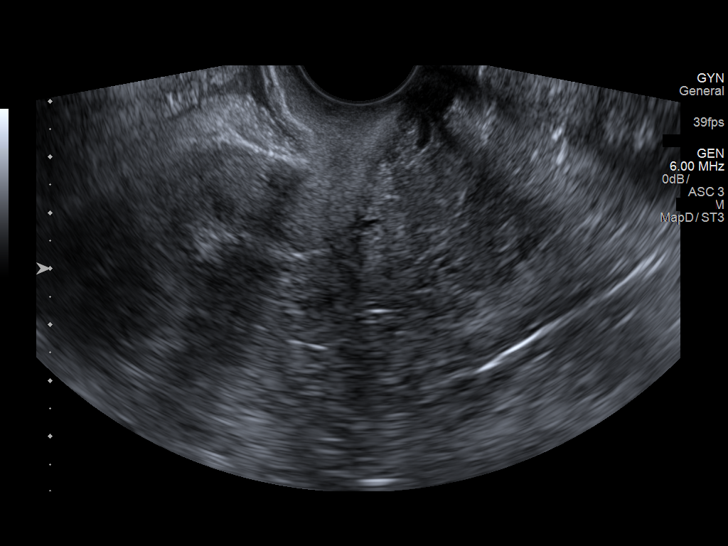
[im 10/27]
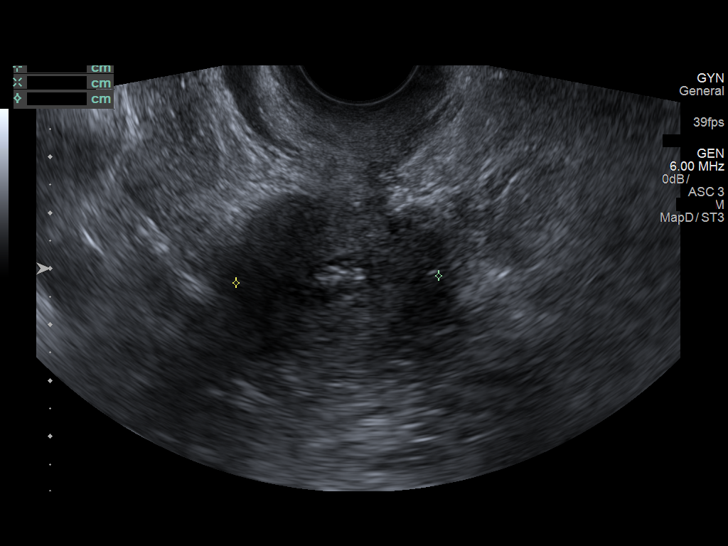
[im 12/27]
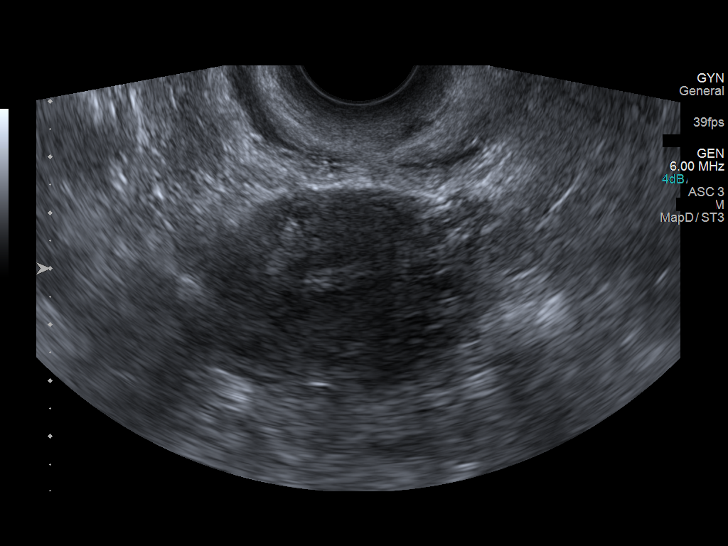
[im 15/27]
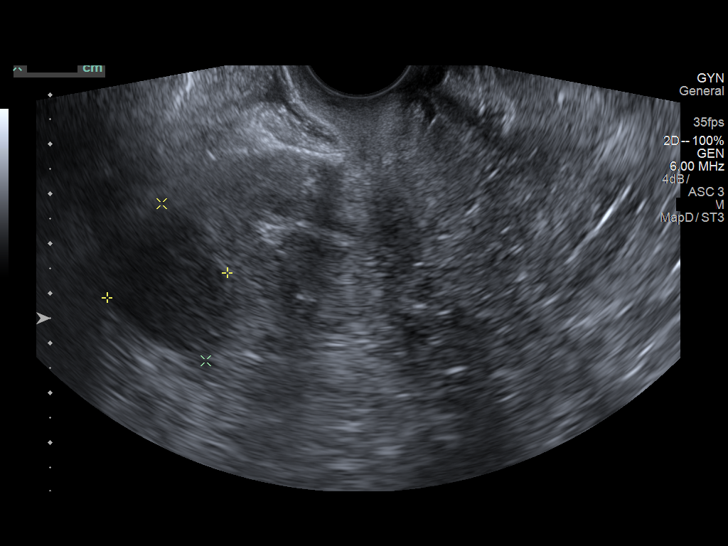
[im 17/27]
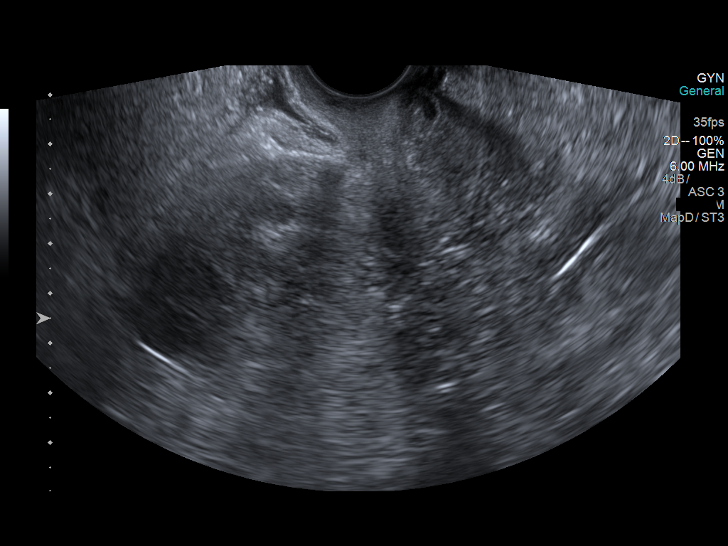
[im 18/27]
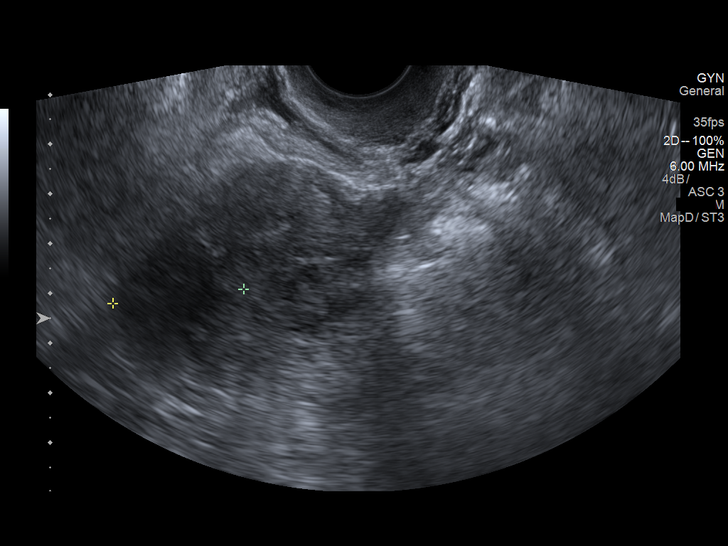
[im 20/27]
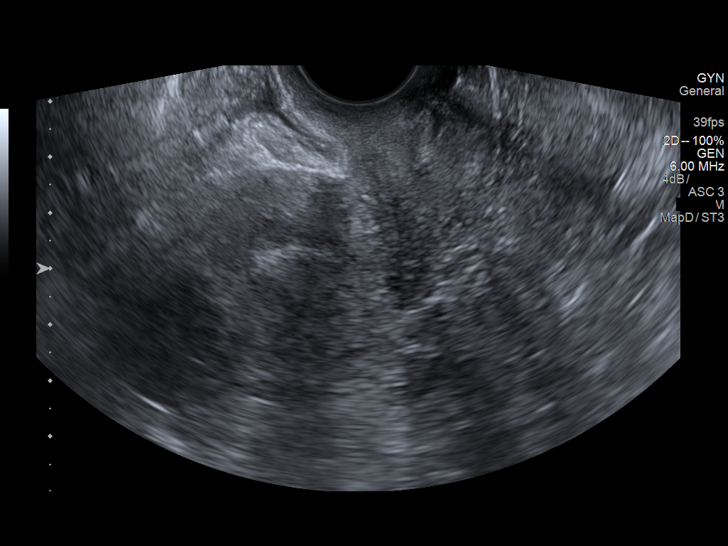
[im 22/27]
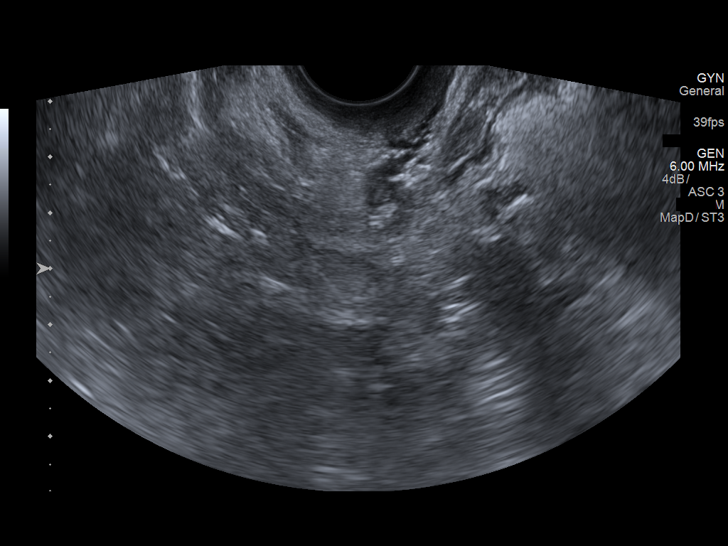
[im 24/27]
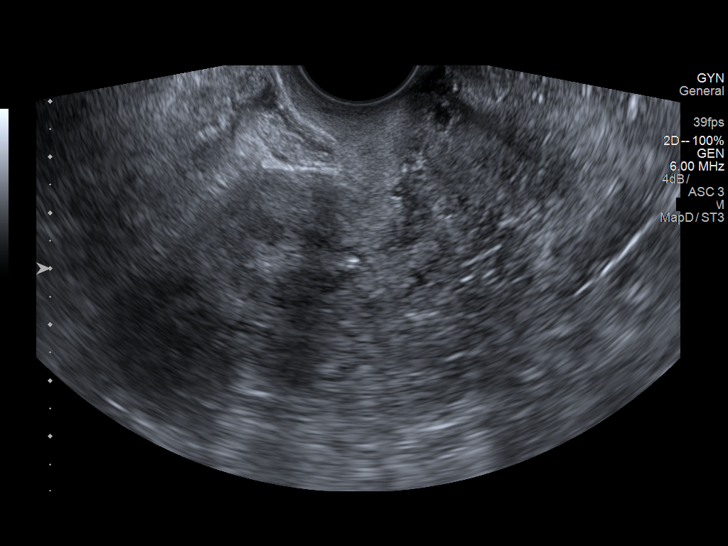
[im 27/27]
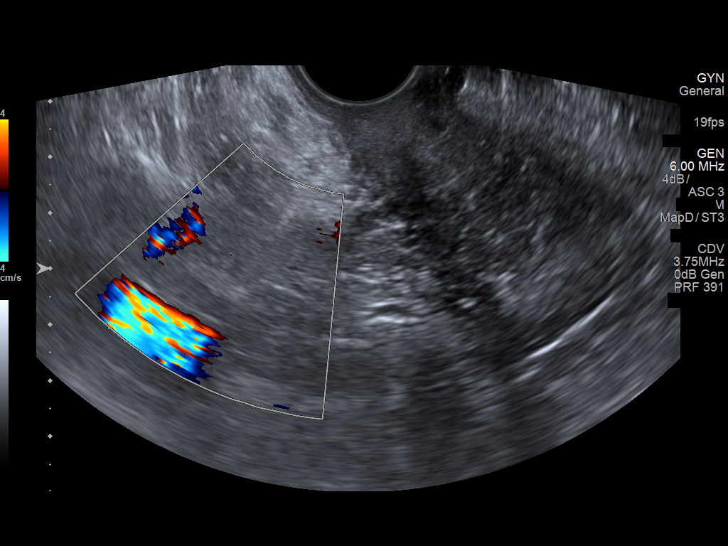

[14 of 25 positions shown; findings below may reference images not displayed]

FINDINGS: Uterus

Measurements: 6.8 x 3.3 x 3.6 cm = volume: 43 mL. The uterus is
anteverted. There is a there is a 3.1 x 3.2 cm right fundal
partially exophytic fibroid.

Endometrium

Thickness: The endometrium measures up to 7 mm in thickness. Further
evaluation with hysteroscopy is recommended in this patient with
postmenopausal bleeding.

Right ovary

Not visualized.

Left ovary

Not visualized.

Other findings

No abnormal free fluid.
IMPRESSION: 1. Thickened endometrium. Further evaluation with hysteroscopy
recommended.
2. Right fundal exophytic fibroid.

## 2021-04-22 ENCOUNTER — Inpatient Hospital Stay: Admit: 2021-04-22 | Discharge: 2021-04-22 | Disposition: A | Discharge status: Home / Self Care | Payer: Self-pay

## 2021-04-30 ENCOUNTER — Other Ambulatory Visit: Payer: Self-pay | Admitting: Obstetrics

## 2021-04-30 DIAGNOSIS — Z13820 Encounter for screening for osteoporosis: Secondary | ICD-10-CM

## 2021-11-28 ENCOUNTER — Other Ambulatory Visit: Payer: Self-pay | Admitting: *Deleted

## 2021-11-28 DIAGNOSIS — M810 Age-related osteoporosis without current pathological fracture: Secondary | ICD-10-CM

## 2021-12-12 ENCOUNTER — Other Ambulatory Visit: Payer: Self-pay | Admitting: Physician Assistant

## 2021-12-12 DIAGNOSIS — N23 Unspecified renal colic: Secondary | ICD-10-CM

## 2022-01-08 ENCOUNTER — Ambulatory Visit (INDEPENDENT_AMBULATORY_CARE_PROVIDER_SITE_OTHER): Payer: Medicare Other | Admitting: Pulmonary Disease

## 2022-01-08 ENCOUNTER — Ambulatory Visit
Admission: RE | Admit: 2022-01-08 | Discharge: 2022-01-08 | Disposition: A | Discharge status: Home / Self Care | Payer: Medicare Other | Source: Ambulatory Visit | Attending: Physician Assistant | Admitting: Physician Assistant

## 2022-01-08 ENCOUNTER — Encounter: Payer: Self-pay | Admitting: Pulmonary Disease

## 2022-01-08 VITALS — BP 128/76 | HR 79 | Ht 60.0 in | Wt 198.8 lb

## 2022-01-08 DIAGNOSIS — N23 Unspecified renal colic: Secondary | ICD-10-CM

## 2022-01-08 DIAGNOSIS — J411 Mucopurulent chronic bronchitis: Secondary | ICD-10-CM

## 2022-01-08 MED ORDER — TRELEGY ELLIPTA 200-62.5-25 MCG/ACT IN AEPB: 1.0000 | INHALATION_SPRAY | Freq: Every day | RESPIRATORY_TRACT | 0 refills | Status: DC

## 2022-01-08 NOTE — Progress Notes (Signed)
$'@Patient'R$  ID: Sabrina Collins, female    DOB: February 10, 1951, 71 y.o.   MRN: [id number]  Chief Complaint  Patient presents with   Consult    Consult for copd. Pt is on symbicort daily. Pt states the inhaler helps some what with her symptoms. CT of Abdomen was done today.     Referring provider: Everardo Beals, NP  HPI:   71 y.o. woman whom are seeing in consultation for evaluation of dyspnea, recurrent bronchitis, asthma.  Note from referring provider reviewed.  Patient reports onset history of asthma.  On intermittent inhalers.  She reports recurrent bouts of bronchitis.  3-4 times a year.  Usually worse in the winter.  Triggered by what sounds like viral illnesses or allergen/pollens.  Usually takes many weeks to resolve in terms of bad cough and worsening dyspnea.  Usually eventually gets prednisone with improvement.  But with recurrence of symptoms frequently as described.  During these episodes she will resume an ICS/LABA inhaler.  Historically had been using Symbicort.  Unfortunately, this became very expensive.  She currently has some Symbicort but also was given a sample of air duo.  In between these episodes of several weeks of bronchitis-like symptoms, she says she feels fine.  Minimal to no cough.  Minimal to no dyspnea.  Does well.  Most recent chest x-ray 01/2016 reviewed and interpreted as clear lungs, slightly low volumes, mild hyperinflation on the lateral view.  Review of lung images from CT/abdomen/pelvis today personally reviewed and interpreted as clear lungs with evidence of mosaicism concerning for air trapping.  PMH: Asthma, hypothyroidism, hyperlipidemia, hypertension Surgical history: tonsillectomy, tubal ligation Family history: Mother with lung cancer, father with colon cancer Social history: Never smoker, lives in [place] / Pulmonary Flowsheets:   ACT:      No data to display          MMRC:     No data to display           Epworth:      No data to display          Tests:   FENO:  No results found for: "NITRICOXIDE"  PFT:     No data to display          WALK:      No data to display          Imaging: Personally reviewed and as per EMR discussion this note No results found.  Lab Results: Personally reviewed CBC    Component Value Date/Time   WBC 7.7 03/10/2018 0300   RBC 4.06 03/10/2018 0300   HGB 12.0 03/10/2018 0300   HCT 37.6 03/10/2018 0300   PLT 261 03/10/2018 0300   MCV 92.6 03/10/2018 0300   MCH 29.6 03/10/2018 0300   MCHC 31.9 03/10/2018 0300   RDW 13.2 03/10/2018 0300   LYMPHSABS 2.0 08/03/2009 0136   MONOABS 0.5 08/03/2009 0136   EOSABS 0.0 08/03/2009 0136   BASOSABS 0.1 08/03/2009 0136    BMET    Component Value Date/Time   NA 141 03/10/2018 0300   K 3.5 03/10/2018 1253   CL 100 03/10/2018 0300   CO2 32 03/10/2018 0300   GLUCOSE 110 (H) 03/10/2018 0300   BUN 12 03/10/2018 0300   CREATININE 0.94 03/10/2018 0300   CALCIUM 7.9 (L) 03/10/2018 0300   GFRNONAA >60 03/10/2018 0300   GFRAA >60 03/10/2018 0300    BNP No results found for: "BNP"  ProBNP    Component  Value Date/Time   PROBNP <30.0 08/04/2009 0716    Specialty Problems   None   Allergies  Allergen Reactions   Codeine Rash   Penicillins Rash    Has patient had a PCN reaction causing immediate rash, facial/tongue/throat swelling, SOB or lightheadedness with hypotension: Yes Has patient had a PCN reaction causing severe rash involving mucus membranes or skin necrosis: Unk Has patient had a PCN reaction that required hospitalization: No Has patient had a PCN reaction occurring within the last 10 years: No If all of the above answers are "NO", then may proceed with Cephalosporin use.     Immunization History  Administered Date(s) Administered   Influenza-Unspecified 03/18/2020   PFIZER(Purple Top)SARS-COV-2 Vaccination 06/16/2019, 07/11/2019    Past Medical History:   Diagnosis Date   Asthma    PER PATIENT    Hypertension    Hypokalemia 02/2018    Tobacco History: Social History   Tobacco Use  Smoking Status Never  Smokeless Tobacco Never   Counseling given: Not Answered   Continue to not smoke  Outpatient Encounter Medications as of 01/08/2022  Medication Sig   Albuterol Sulfate (PROAIR HFA IN) Inhale 2 puffs into the lungs every 6 (six) hours as needed (for shortness of breath or wheezing).    benzonatate (TESSALON) 100 MG capsule Take 1 capsule by mouth every 8 (eight) hours for cough.   carvedilol (COREG) 6.25 MG tablet carvedilol 6.25 mg tablet  TAKE 1 TABLET BY MOUTH TWICE DAILY   chlorpheniramine-HYDROcodone (TUSSIONEX) 10-8 MG/5ML SUER Take 5 mLs by mouth every 12 (twelve) hours as needed for cough.   Fluticasone-Salmeterol,sensor, (AIRDUO DIGIHALER IN) Inhale into the lungs.   furosemide (LASIX) 40 MG tablet Take 40 mg by mouth daily.   hydrochlorothiazide (HYDRODIURIL) 25 MG tablet Take 25 mg by mouth daily.   ibuprofen (ADVIL,MOTRIN) 200 MG tablet Take 400 mg by mouth every 6 (six) hours as needed for headache or mild pain.   levothyroxine (SYNTHROID, LEVOTHROID) 25 MCG tablet Take 25 mcg by mouth daily before breakfast.    meclizine (ANTIVERT) 25 MG tablet Take 25 mg by mouth every 12 (twelve) hours as needed for dizziness.    ondansetron (ZOFRAN ODT) 4 MG disintegrating tablet Take 1 tablet (4 mg total) by mouth every 8 (eight) hours as needed for nausea or vomiting.   potassium chloride SA (K-DUR,KLOR-CON) 20 MEQ tablet Take 1 tablet (20 mEq total) by mouth 2 (two) times daily.   rosuvastatin (CRESTOR) 10 MG tablet Take 10 mg by mouth daily.   No facility-administered encounter medications on file as of 01/08/2022.     Review of Systems  Review of Systems  No chest pain with exertion.  No orthopnea or PND.  Comprehensive review of systems otherwise negative. Physical Exam  BP 128/76 (BP Location: Left Arm, Patient  Position: Sitting, Cuff Size: Normal)   Pulse 79   Ht 5' (1.524 m)   Wt 198 lb 12.8 oz (90.2 kg)   SpO2 93%   BMI 38.83 kg/m   Wt Readings from Last 5 Encounters:  01/08/22 198 lb 12.8 oz (90.2 kg)  03/08/18 208 lb (94.3 kg)  02/06/16 197 lb 11.2 oz (89.7 kg)    BMI Readings from Last 5 Encounters:  01/08/22 38.83 kg/m  03/08/18 40.62 kg/m     Physical Exam General: Sitting in chair, no acute distress Eyes: EOMI, intersex Neck: Supple, no JVP Pulmonary: Clear, normal work of breathing Cardiovascular: Warm, no edema Abdomen: Nondistended, bowel sounds present MSK:  No synovitis, no joint effusion Neuro: Normal gait, no weakness Psych: Normal mood, full affect    Assessment & Plan:   Asthma: Longstanding diagnosis.  Recurrent bronchitis multiple times of the year.  3-4 courses of prednisone yearly.  Poorly controlled.  In between bouts of bronchitis minimal symptoms of cough or dyspnea.  Discussed at length the importance of maintenance inhaler to keep lung health at a maximum and to better tolerate insults such as viruses or allergens that can lead to bronchitis-like symptoms.  Given recurrent exacerbations in the past, start Trelegy high-dose 1 puff daily.  Currently using Symbicort or AirDuo, ICS/LABA therapy seasonally with bouts of bronchitis.  Continue albuterol as needed.  Recurrent bronchitis: Likely reflective of poorly controlled asthma.  Maintenance inhaler therapy as above.   Return in about 3 months (around 04/10/2022).   Lanier Clam, MD 01/08/2022

## 2022-01-08 NOTE — Patient Instructions (Signed)
Nice to meet you  As we discussed, I think a daily maintenance inhaler is very important to maintaining your lung health and helping prevent episodes of bronchitis.  Take Trelegy 1 puff once a day.  Rinse your mouth out after every use.  I provided samples today.  We can consider applying for manufacturing assistance in the future if cost remains an issue.  I am hopeful in the new year the cost will be better based on the change in your insurance plan.  You are welcome to contact us, as for my nurse, April Smith, if you need additional samples.  We may not have them in the future but you are welcome to check with [place].  In the meantime if you are in between supply of the Trelegy inhaler, use your supply of Symbicort and air duo.  The Symbicort is 2 puffs twice a day.  The air duo is 1 puff twice a day.  Return to clinic in 3 months or sooner as needed with Dr. Silas Flood

## 2022-02-05 ENCOUNTER — Encounter: Payer: Self-pay | Admitting: *Deleted

## 2022-04-28 ENCOUNTER — Inpatient Hospital Stay: Admit: 2022-04-28 | Discharge: 2022-04-28 | Disposition: A | Discharge status: Home / Self Care | Payer: Self-pay

## 2022-07-27 ENCOUNTER — Encounter (HOSPITAL_COMMUNITY): Payer: Self-pay

## 2022-07-27 ENCOUNTER — Other Ambulatory Visit: Payer: Self-pay

## 2022-07-27 ENCOUNTER — Emergency Department (HOSPITAL_COMMUNITY)
Admission: EM | Admit: 2022-07-27 | Discharge: 2022-07-28 | Disposition: A | Discharge status: Home / Self Care | Payer: Medicare Other | Attending: Emergency Medicine | Admitting: Emergency Medicine

## 2022-07-27 DIAGNOSIS — R1031 Right lower quadrant pain: Secondary | ICD-10-CM | POA: Diagnosis present

## 2022-07-27 DIAGNOSIS — I10 Essential (primary) hypertension: Secondary | ICD-10-CM | POA: Diagnosis not present

## 2022-07-27 DIAGNOSIS — N201 Calculus of ureter: Secondary | ICD-10-CM

## 2022-07-27 DIAGNOSIS — R7989 Other specified abnormal findings of blood chemistry: Secondary | ICD-10-CM | POA: Insufficient documentation

## 2022-07-27 DIAGNOSIS — Z79899 Other long term (current) drug therapy: Secondary | ICD-10-CM | POA: Insufficient documentation

## 2022-07-27 DIAGNOSIS — J45909 Unspecified asthma, uncomplicated: Secondary | ICD-10-CM | POA: Insufficient documentation

## 2022-07-27 DIAGNOSIS — N132 Hydronephrosis with renal and ureteral calculous obstruction: Secondary | ICD-10-CM | POA: Diagnosis not present

## 2022-07-27 DIAGNOSIS — Z7951 Long term (current) use of inhaled steroids: Secondary | ICD-10-CM | POA: Diagnosis not present

## 2022-07-27 LAB — COMPREHENSIVE METABOLIC PANEL
ALT: 16 U/L (ref 0–44)
AST: 18 U/L (ref 15–41)
Albumin: 3.4 g/dL — ABNORMAL LOW (ref 3.5–5.0)
Alkaline Phosphatase: 56 U/L (ref 38–126)
Anion gap: 10 (ref 5–15)
BUN: 24 mg/dL — ABNORMAL HIGH (ref 8–23)
CO2: 26 mmol/L (ref 22–32)
Calcium: 8.7 mg/dL — ABNORMAL LOW (ref 8.9–10.3)
Chloride: 104 mmol/L (ref 98–111)
Creatinine, Ser: 1.35 mg/dL — ABNORMAL HIGH (ref 0.44–1.00)
GFR, Estimated: 43 mL/min — ABNORMAL LOW (ref >60)
Glucose, Bld: 105 mg/dL — ABNORMAL HIGH (ref 70–99)
Potassium: 4.3 mmol/L (ref 3.5–5.1)
Sodium: 140 mmol/L (ref 135–145)
Total Bilirubin: 0.3 mg/dL (ref 0.3–1.2)
Total Protein: 6.5 g/dL (ref 6.5–8.1)

## 2022-07-27 LAB — URINALYSIS, ROUTINE W REFLEX MICROSCOPIC
Bilirubin Urine: NEGATIVE
Glucose, UA: NEGATIVE mg/dL
Ketones, ur: NEGATIVE mg/dL
Leukocytes,Ua: NEGATIVE
Nitrite: NEGATIVE
Protein, ur: NEGATIVE mg/dL
Specific Gravity, Urine: 1.025 (ref 1.005–1.030)
pH: 5 (ref 5.0–8.0)

## 2022-07-27 LAB — CBC WITH DIFFERENTIAL/PLATELET
Abs Immature Granulocytes: 0.02 10*3/uL (ref 0.00–0.07)
Basophils Absolute: 0.1 10*3/uL (ref 0.0–0.1)
Basophils Relative: 1 %
Eosinophils Absolute: 0.2 10*3/uL (ref 0.0–0.5)
Eosinophils Relative: 1 %
HCT: 42.4 % (ref 36.0–46.0)
Hemoglobin: 14 g/dL (ref 12.0–15.0)
Immature Granulocytes: 0 %
Lymphocytes Relative: 21 %
Lymphs Abs: 2.2 10*3/uL (ref 0.7–4.0)
MCH: 29.9 pg (ref 26.0–34.0)
MCHC: 33 g/dL (ref 30.0–36.0)
MCV: 90.4 fL (ref 80.0–100.0)
Monocytes Absolute: 0.9 10*3/uL (ref 0.1–1.0)
Monocytes Relative: 9 %
Neutro Abs: 7.1 10*3/uL (ref 1.7–7.7)
Neutrophils Relative %: 68 %
Platelets: 260 10*3/uL (ref 150–400)
RBC: 4.69 MIL/uL (ref 3.87–5.11)
RDW: 13.2 % (ref 11.5–15.5)
WBC: 10.4 10*3/uL (ref 4.0–10.5)
nRBC: 0 % (ref 0.0–0.2)

## 2022-07-27 NOTE — ED Notes (Signed)
Pt arrived ambulatory to room with steady gait c/o 2 day hx of dull intermittent right flank pain. Pt a/o x 4 respirations even and non labored pt is htn and c/o pain 10/10 asking for pain medications. Pt pending evaluation and further care

## 2022-07-27 NOTE — ED Triage Notes (Signed)
Patient arrived POV from home with complaint of right sided flank pain 2 days ago with nausea.  Reports pain as intermittent & dull. Denies vomiting

## 2022-07-28 ENCOUNTER — Emergency Department (HOSPITAL_COMMUNITY): Payer: Medicare Other

## 2022-07-28 ENCOUNTER — Telehealth: Payer: Self-pay | Admitting: *Deleted

## 2022-07-28 MED ORDER — HYDROMORPHONE HCL 1 MG/ML IJ SOLN
0.5000 mg | Freq: Once | INTRAMUSCULAR | Status: AC
  Administered 2022-07-28: 0.5 mg via INTRAVENOUS
  Filled 2022-07-28: qty 1

## 2022-07-28 MED ORDER — SODIUM CHLORIDE 0.9 % IV BOLUS
500.0000 mL | Freq: Once | INTRAVENOUS | Status: AC
  Administered 2022-07-28: 500 mL via INTRAVENOUS

## 2022-07-28 MED ORDER — ONDANSETRON 4 MG PO TBDP: 4.0000 mg | ORAL_TABLET | Freq: Three times a day (TID) | ORAL | 0 refills | Status: DC | PRN

## 2022-07-28 MED ORDER — KETOROLAC TROMETHAMINE 15 MG/ML IJ SOLN
7.5000 mg | Freq: Once | INTRAMUSCULAR | Status: AC
  Administered 2022-07-28: 7.5 mg via INTRAVENOUS
  Filled 2022-07-28: qty 1

## 2022-07-28 MED ORDER — ONDANSETRON HCL 4 MG/2ML IJ SOLN
4.0000 mg | Freq: Once | INTRAMUSCULAR | Status: AC
  Administered 2022-07-28: 4 mg via INTRAVENOUS
  Filled 2022-07-28: qty 2

## 2022-07-28 MED ORDER — TAMSULOSIN HCL 0.4 MG PO CAPS: 0.4000 mg | ORAL_CAPSULE | Freq: Every day | ORAL | 0 refills | Status: DC

## 2022-07-28 MED ORDER — HYDROCODONE-ACETAMINOPHEN 5-325 MG PO TABS: 1.0000 | ORAL_TABLET | ORAL | 0 refills | Status: AC | PRN

## 2022-07-28 NOTE — Telephone Encounter (Signed)
Pt called regarding which pharmacy Rx was e-scribed to.  RNCM reviewed chart to access After Visit Summary and found that Rx was sent to [facility] on [address].  Advised pt to pick up at her convenience.  

## 2022-07-28 NOTE — ED Provider Notes (Signed)
[place] EMERGENCY DEPARTMENT AT [facility] Provider Note   CSN: [id number] Arrival date & time: 07/27/22  2159     History  Chief Complaint  Patient presents with   Flank Pain    Sabrina Collins is a 72 y.o. female.  72 year old female with history of  HTN, asthma presents with complaint of RLQ pain onset a few days ago, was intermittent, now constant and worsening, radiates into right flank. No changes in bowel or bladder habits, last bowel movement this morning. Nausea without vomiting. No fevers or chills.  Prior tubaligation, no other prior abdominal surgeries.        Home Medications Prior to Admission medications   Medication Sig Start Date End Date Taking? Authorizing Provider  HYDROcodone-acetaminophen (NORCO/VICODIN) 5-325 MG tablet Take 1 tablet by mouth every 4 (four) hours as needed. 07/28/22  Yes Jeannie Fend, PA-C  ondansetron (ZOFRAN-ODT) 4 MG disintegrating tablet Take 1 tablet (4 mg total) by mouth every 8 (eight) hours as needed for nausea or vomiting. 07/28/22  Yes Jeannie Fend, PA-C  tamsulosin (FLOMAX) 0.4 MG CAPS capsule Take 1 capsule (0.4 mg total) by mouth daily. 07/28/22  Yes Jeannie Fend, PA-C  Albuterol Sulfate (PROAIR HFA IN) Inhale 2 puffs into the lungs every 6 (six) hours as needed (for shortness of breath or wheezing).     [provider]  benzonatate (TESSALON) 100 MG capsule Take 1 capsule by mouth every 8 (eight) hours for cough. 03/27/20   Mardella Layman, MD  carvedilol (COREG) 6.25 MG tablet carvedilol 6.25 mg tablet  TAKE 1 TABLET BY MOUTH TWICE DAILY    [provider]  chlorpheniramine-HYDROcodone (TUSSIONEX) 10-8 MG/5ML SUER Take 5 mLs by mouth every 12 (twelve) hours as needed for cough. 12/24/17   [provider]  Fluticasone-Salmeterol,sensor, (AIRDUO DIGIHALER IN) Inhale into the lungs.    [provider]  Fluticasone-Umeclidin-Vilant (TRELEGY ELLIPTA) 200-62.5-25 MCG/ACT AEPB  Inhale 1 puff into the lungs daily. 01/08/22   Hunsucker, Lesia Sago, MD  furosemide (LASIX) 40 MG tablet Take 40 mg by mouth daily.    [provider]  hydrochlorothiazide (HYDRODIURIL) 25 MG tablet Take 25 mg by mouth daily.    [provider]  ibuprofen (ADVIL,MOTRIN) 200 MG tablet Take 400 mg by mouth every 6 (six) hours as needed for headache or mild pain.    [provider]  levothyroxine (SYNTHROID, LEVOTHROID) 25 MCG tablet Take 25 mcg by mouth daily before breakfast.  02/05/18   [provider]  meclizine (ANTIVERT) 25 MG tablet Take 25 mg by mouth every 12 (twelve) hours as needed for dizziness.     [provider]  potassium chloride SA (K-DUR,KLOR-CON) 20 MEQ tablet Take 1 tablet (20 mEq total) by mouth 2 (two) times daily. 03/10/18   Mikhail, Nita Sells, DO  rosuvastatin (CRESTOR) 10 MG tablet Take 10 mg by mouth daily. 03/20/20   [provider]      Allergies    Codeine and Penicillins    Review of Systems   Review of Systems Negative except as per HPI Physical Exam Updated Vital Signs BP (!) 191/89   Pulse 81   Temp 98.5 F (36.9 C) (Oral)   Resp (!) 30   Ht 5\' 1"  (1.549 m)   SpO2 100%   BMI 37.56 kg/m  Physical Exam Vitals and nursing note reviewed.  Constitutional:      General: She is not in acute distress.  Appearance: She is well-developed. She is obese. She is not diaphoretic.     Comments: Appears uncomfortable   HENT:     Head: Normocephalic and atraumatic.  Cardiovascular:     Rate and Rhythm: Normal rate and regular rhythm.     Heart sounds: Normal heart sounds.  Pulmonary:     Effort: Pulmonary effort is normal.     Breath sounds: Normal breath sounds.  Abdominal:     Palpations: Abdomen is soft.     Tenderness: There is abdominal tenderness in the right lower quadrant. There is no right CVA tenderness or left CVA tenderness.  Musculoskeletal:     Right lower leg: No edema.     Left lower leg:  No edema.  Skin:    General: Skin is warm and dry.     Findings: No erythema or rash.  Neurological:     Mental Status: She is alert and oriented to person, place, and time.  Psychiatric:        Behavior: Behavior normal.     ED Results / Procedures / Treatments   Labs (all labs ordered are listed, but only abnormal results are displayed) Labs Reviewed  URINALYSIS, ROUTINE W REFLEX MICROSCOPIC - Abnormal; Notable for the following components:      Result Value   Hgb urine dipstick MODERATE (*)    Bacteria, UA RARE (*)    All other components within normal limits  COMPREHENSIVE METABOLIC PANEL - Abnormal; Notable for the following components:   Glucose, Bld 105 (*)    BUN 24 (*)    Creatinine, Ser 1.35 (*)    Calcium 8.7 (*)    Albumin 3.4 (*)    GFR, Estimated 43 (*)    All other components within normal limits  CBC WITH DIFFERENTIAL/PLATELET    EKG None  Radiology CT Renal Stone Study  Result Date: 07/28/2022 CLINICAL DATA:  Right-sided flank pain for 2 days, initial encounter EXAM: CT ABDOMEN AND PELVIS WITHOUT CONTRAST TECHNIQUE: Multidetector CT imaging of the abdomen and pelvis was performed following the standard protocol without IV contrast. RADIATION DOSE REDUCTION: This exam was performed according to the departmental dose-optimization program which includes automated exposure control, adjustment of the mA and/or kV according to patient size and/or use of iterative reconstruction technique. COMPARISON:  04/09/2022 FINDINGS: Lower chest: No acute abnormality. Hepatobiliary: Liver is stable in appearance. Decompressed gallbladder is noted. Common bile duct is mildly prominent at 11 mm. Correlate with laboratory values. Pancreas: Unremarkable. No pancreatic ductal dilatation or surrounding inflammatory changes. Spleen: Normal in size without focal abnormality. Adrenals/Urinary Tract: Adrenal glands are within normal limits bilaterally. Left kidney shows no renal calculi or  obstructive changes. Right kidney demonstrates hydronephrosis and hydroureter secondary to a tiny 1-2 mm right UVJ stone. The bladder is partially distended. Stomach/Bowel: Scattered diverticular change of the colon is noted. No diverticulitis is seen. The appendix is within normal limits. Small bowel and stomach are unremarkable. Vascular/Lymphatic: Mild atherosclerotic calcifications are noted. No lymphadenopathy is seen. Reproductive: Uterus and bilateral adnexa are unremarkable. Other: No abdominal wall hernia or abnormality. No abdominopelvic ascites. Musculoskeletal: No acute or significant osseous findings. IMPRESSION: 1-2 mm right UVJ stone with right hydronephrosis and hydroureter. Diverticulosis without diverticulitis. Electronically Signed   By: Alcide Clever M.D.   On: 07/28/2022 01:18    Procedures Procedures    Medications Ordered in ED Medications  ondansetron (ZOFRAN) injection 4 mg (4 mg Intravenous Given 07/28/22 0033)  HYDROmorphone (DILAUDID) injection 0.5 mg (0.5  mg Intravenous Given 07/28/22 0034)  sodium chloride 0.9 % bolus 500 mL (0 mLs Intravenous Stopped 07/28/22 0123)  ketorolac (TORADOL) 15 MG/ML injection 7.5 mg (7.5 mg Intravenous Given 07/28/22 0157)    ED Course/ Medical Decision Making/ A&P                             Medical Decision Making Amount and/or Complexity of Data Reviewed Labs: ordered. Radiology: ordered.  Risk Prescription drug management.   This patient presents to the ED for concern of RLQ pain, this involves an extensive number of treatment options, and is a complaint that carries with it a high risk of complications and morbidity.  The differential diagnosis includes but not limited to appendicitis, SBO, colitis, ureterolithiasis, ovarian cyst vs mass   Co morbidities that complicate the patient evaluation  HTN, asthma   Additional history obtained:  Additional history obtained from spouse at bedside who contributes to history as  above External records from outside source obtained and reviewed including no recent labs on file for comparison, available labs were reviewed    Lab Tests:  I Ordered, and personally interpreted labs.  The pertinent results include:  CBC wnl, cmp with elevated Cr at 1.35 (no recent prior to compare). UA with moderate hgb, 21-50 RBC, rare bacteria.   Imaging Studies ordered:  I ordered imaging studies including CT stone study  I independently visualized and interpreted imaging which showed distal right ureteral stone I agree with the radiologist interpretation   Consultations Obtained:  I requested consultation with the ER attending, Dr. Bebe Shaggy,  and discussed lab and imaging findings as well as pertinent plan - they recommend: agrees with plan for pain control with Toradol and likely dc with plan to follow up with urology.   Problem List / ED Course / Critical interventions / Medication management  72 year old female presents with complaint of right lower quadrant pain intermittent for the past few days now constant, radiating to right flank with nausea and urinary frequency.  Found to have microscopic hematuria without significant changes to her CBC, CMP with mildly elevated creatinine.  Mild right lower quadrant tenderness without guarding or rebound, no CVA tenderness, suspect kidney stone, CT without contrast obtained.  Patient is found to have a distal right ureteral stone with mild hydro-.  Minimal pain relief with Dilaudid and Zofran provided, better pain control with low-dose Toradol given age and renal function.  Patient was also provided with IV fluids.  Discussed findings and plan of care with patient, recommend follow-up with urology, strain urine, strict return to ER precautions provided.  Given prescriptions for Norco, Zofran, Flomax.  Patient and husband verbalized understanding of discharge instructions and plan. I ordered medication including dilaudid, zofran, toradol  for  pain  Reevaluation of the patient after these medicines showed that the patient improved I have reviewed the patients home medicines and have made adjustments as needed   Social Determinants of Health:  Lives with spouse   Test / Admission - Considered:  Dc with plan for urology follow up         Final Clinical Impression(s) / ED Diagnoses Final diagnoses:  Ureterolithiasis    Rx / DC Orders ED Discharge Orders          Ordered    tamsulosin (FLOMAX) 0.4 MG CAPS capsule  Daily        07/28/22 0201    HYDROcodone-acetaminophen (NORCO/VICODIN) 5-325 MG tablet  Every 4 hours PRN        07/28/22 0201    ondansetron (ZOFRAN-ODT) 4 MG disintegrating tablet  Every 8 hours PRN        07/28/22 0201              Jeannie Fend, PA-C 07/28/22 Edwyna Perfect, MD 07/28/22 [phone]

## 2022-07-28 NOTE — Discharge Instructions (Addendum)
Return to the ER for fever, pain or nausea not controlled with medications. Strain urine. Take stone to urology follow up. Call urology tomorrow to schedule follow up appointment.  Take Norco as needed as prescribed for pain. This medication can cause constipation, take a stool softener daily if taking this medication. Do NOT drive if taking this medication.  Zofran as needed as prescribed for nausea and vomiting.  Flomax daily as prescribed.

## 2022-09-08 ENCOUNTER — Ambulatory Visit: Payer: Medicare Other | Admitting: Pulmonary Disease

## 2022-09-11 ENCOUNTER — Ambulatory Visit: Payer: Medicare Other | Admitting: Pulmonary Disease

## 2022-09-11 ENCOUNTER — Encounter: Payer: Self-pay | Admitting: Pulmonary Disease

## 2022-09-11 VITALS — BP 132/84 | HR 78 | Temp 97.6°F | Ht 60.0 in | Wt 201.6 lb

## 2022-09-11 DIAGNOSIS — J4530 Mild persistent asthma, uncomplicated: Secondary | ICD-10-CM

## 2022-09-11 MED ORDER — TRELEGY ELLIPTA 200-62.5-25 MCG/ACT IN AEPB: 1.0000 | INHALATION_SPRAY | Freq: Every day | RESPIRATORY_TRACT | 11 refills | Status: AC

## 2022-09-11 NOTE — Patient Instructions (Signed)
Nice to see you again  No changes to medicines  I refilled Trelegy - rinse mouth out after every use  Return to clinic as needed

## 2022-09-11 NOTE — Progress Notes (Signed)
@Patient  ID: Sabrina Collins, female    DOB: [birth year], 72 y.o.   MRN: [id number]  Chief Complaint  Patient presents with   Follow-up    Breathing has been good.  ACT 24    Referring provider: Marva Panda, NP  HPI:   72 y.o. woman whom are seeing  for evaluation of dyspnea, recurrent bronchitis, asthma.  Note from referring provider reviewed.  Returns for routine follow-up.  Doing well.  Trelegy at last visit.  Breathing is gotten better.  Using Trelegy rarely.  Even without Trelegy his breathing is doing much better.  No complaints today.  HPI at initial visit: 72 y.o. woman reports onset history of asthma.  Patient reports onset history of asthma.  On intermittent inhalers.  She reports recurrent bouts of bronchitis.  3-4 times a year.  Usually worse in the winter.  Triggered by what sounds like viral illnesses or allergen/pollens.  Usually takes many weeks to resolve in terms of bad cough and worsening dyspnea.  Usually eventually gets prednisone with improvement.  But with recurrence of symptoms frequently as described.  During these episodes she will resume an ICS/LABA inhaler.  Historically had been using Symbicort.  Unfortunately, this became very expensive.  She currently has some Symbicort but also was given a sample of air duo.  In between these episodes of several weeks of bronchitis-like symptoms, she says she feels fine.  Minimal to no cough.  Minimal to no dyspnea.  Does well.  Most recent chest x-ray 01/2016 reviewed and interpreted as clear lungs, slightly low volumes, mild hyperinflation on the lateral view.  Review of lung images from CT/abdomen/pelvis today personally reviewed and interpreted as clear lungs with evidence of mosaicism concerning for air trapping.  PMH: Asthma, hypothyroidism, hyperlipidemia, hypertension Surgical history: tonsillectomy, tubal ligation Family history: Mother with lung cancer, father with colon cancer Social history: Never smoker, lives in [place] /  Pulmonary Flowsheets:   ACT:  Asthma Control Test ACT Total Score  09/11/2022  3:19 PM 24    MMRC:     No data to display           Epworth:      No data to display           Tests:   FENO:  No results found for: "NITRICOXIDE"  PFT:     No data to display           WALK:      No data to display           Imaging: Personally reviewed and as per EMR discussion this note No results found.  Lab Results: Personally reviewed CBC    Component Value Date/Time   WBC 10.4 07/27/2022 2222   RBC 4.69 07/27/2022 2222   HGB 14.0 07/27/2022 2222   HCT 42.4 07/27/2022 2222   PLT 260 07/27/2022 2222   MCV 90.4 07/27/2022 2222   MCH 29.9 07/27/2022 2222   MCHC 33.0 07/27/2022 2222   RDW 13.2 07/27/2022 2222   LYMPHSABS 2.2 07/27/2022 2222   MONOABS 0.9 07/27/2022 2222   EOSABS 0.2 07/27/2022 2222   BASOSABS 0.1 07/27/2022 2222    BMET    Component Value Date/Time   NA 140 07/27/2022 2222   K 4.3 07/27/2022 2222   CL 104 07/27/2022 2222   CO2 26 07/27/2022 2222   GLUCOSE 105 (H) 07/27/2022 2222   BUN 24 (H) 07/27/2022 2222   CREATININE 1.35 (H) 07/27/2022 2222   CALCIUM 8.7 (L) 07/27/2022 2222  GFRNONAA 43 (L) 07/27/2022 2222   GFRAA >60 03/10/2018 0300    BNP No results found for: "BNP"  ProBNP    Component Value Date/Time   PROBNP <30.0 08/04/2009 0716    Specialty Problems   None   Allergies  Allergen Reactions   Other Other (See Comments)   Codeine Rash   Penicillins Rash    Has patient had a PCN reaction causing immediate rash, facial/tongue/throat swelling, SOB or lightheadedness with hypotension: Yes Has patient had a PCN reaction causing severe rash involving mucus membranes or skin necrosis: Unk Has patient had a PCN reaction that required hospitalization: No Has patient had a PCN reaction occurring within the last 10 years: No If all of the above answers are "NO", then may proceed with Cephalosporin use.      Immunization History  Administered Date(s) Administered   Influenza-Unspecified 03/18/2020   PFIZER(Purple Top)SARS-COV-2 Vaccination 06/16/2019, 07/11/2019    Past Medical History:  Diagnosis Date   Asthma    PER PATIENT    Hypertension    Hypokalemia 02/2018    Tobacco History: Social History   Tobacco Use  Smoking Status Never  Smokeless Tobacco Never   Counseling given: Not Answered   Continue to not smoke  Outpatient Encounter Medications as of 09/11/2022  Medication Sig   Albuterol Sulfate (PROAIR HFA IN) Inhale 2 puffs into the lungs every 6 (six) hours as needed (for shortness of breath or wheezing).    carvedilol (COREG) 6.25 MG tablet carvedilol 6.25 mg tablet  TAKE 1 TABLET BY MOUTH TWICE DAILY   chlorpheniramine-HYDROcodone (TUSSIONEX) 10-8 MG/5ML SUER Take 5 mLs by mouth every 12 (twelve) hours as needed for cough.   hydrochlorothiazide (HYDRODIURIL) 25 MG tablet Take 25 mg by mouth daily.   HYDROcodone-acetaminophen (NORCO/VICODIN) 5-325 MG tablet Take 1 tablet by mouth every 4 (four) hours as needed.   ibuprofen (ADVIL,MOTRIN) 200 MG tablet Take 400 mg by mouth every 6 (six) hours as needed for headache or mild pain.   meclizine (ANTIVERT) 25 MG tablet Take 25 mg by mouth every 12 (twelve) hours as needed for dizziness.    potassium chloride SA (K-DUR,KLOR-CON) 20 MEQ tablet Take 1 tablet (20 mEq total) by mouth 2 (two) times daily.   rosuvastatin (CRESTOR) 10 MG tablet Take 10 mg by mouth daily.   [DISCONTINUED] benzonatate (TESSALON) 100 MG capsule Take 1 capsule by mouth every 8 (eight) hours for cough.   [DISCONTINUED] Fluticasone-Salmeterol,sensor, (AIRDUO DIGIHALER IN) Inhale into the lungs.   [DISCONTINUED] Fluticasone-Umeclidin-Vilant (TRELEGY ELLIPTA) 200-62.5-25 MCG/ACT AEPB Inhale 1 puff into the lungs daily.   [DISCONTINUED] furosemide (LASIX) 40 MG tablet Take 40 mg by mouth daily.   [DISCONTINUED] levothyroxine (SYNTHROID, LEVOTHROID) 25 MCG  tablet Take 25 mcg by mouth daily before breakfast.    [DISCONTINUED] ondansetron (ZOFRAN-ODT) 4 MG disintegrating tablet Take 1 tablet (4 mg total) by mouth every 8 (eight) hours as needed for nausea or vomiting.   [DISCONTINUED] tamsulosin (FLOMAX) 0.4 MG CAPS capsule Take 1 capsule (0.4 mg total) by mouth daily.   Fluticasone-Umeclidin-Vilant (TRELEGY ELLIPTA) 200-62.5-25 MCG/ACT AEPB Inhale 1 puff into the lungs daily.   No facility-administered encounter medications on file as of 09/11/2022.     Review of Systems  Review of Systems  N/a Physical Exam  BP 132/84 (BP Location: Left Arm, Patient Position: Sitting, Cuff Size: Large)   Pulse 78   Temp 97.6 F (36.4 C) (Temporal)   Ht 5' (1.524 m)   Wt 201 lb 9.6  oz (91.4 kg)   SpO2 96%   BMI 39.37 kg/m   Wt Readings from Last 5 Encounters:  09/11/22 201 lb 9.6 oz (91.4 kg)  01/08/22 198 lb 12.8 oz (90.2 kg)  03/08/18 208 lb (94.3 kg)  02/06/16 197 lb 11.2 oz (89.7 kg)    BMI Readings from Last 5 Encounters:  09/11/22 39.37 kg/m  07/27/22 37.56 kg/m  01/08/22 38.83 kg/m  03/08/18 40.62 kg/m     Physical Exam General: Sitting in chair, no acute distress Eyes: EOMI, intersex Neck: Supple, no JVP Pulmonary: Clear, normal work of breathing Cardiovascular: Warm, no edema Abdomen: Nondistended, bowel sounds present MSK: No synovitis, no joint effusion Neuro: Normal gait, no weakness Psych: Normal mood, full affect    Assessment & Plan:   Asthma: Longstanding diagnosis.  Recurrent bronchitis multiple times of the year.  3-4 courses of prednisone yearly.  Poorly controlled.  In between bouts of bronchitis minimal symptoms of cough or dyspnea.  Discussed at length the importance of maintenance inhaler to keep lung health at a maximum and to better tolerate insults such as viruses or allergens that can lead to bronchitis-like symptoms.  Given recurrent exacerbations in the past, started Trelegy high-dose 1 puff daily at  last visit.  Symptoms improved.  Using Trelegy rarely.  Refill today.  Continue albuterol as needed.  Recurrent bronchitis: None since last visit.  Likely reflective of poorly controlled asthma.  Maintenance inhaler therapy as above.   Return if symptoms worsen or fail to improve.   Karren Burly, MD 09/11/2022

## 2022-10-02 ENCOUNTER — Ambulatory Visit: Payer: Medicare Other | Attending: Family Medicine | Admitting: Family Medicine

## 2022-10-02 ENCOUNTER — Encounter: Payer: Self-pay | Admitting: Family Medicine

## 2022-10-02 ENCOUNTER — Other Ambulatory Visit: Payer: Self-pay

## 2022-10-02 VITALS — BP 145/77 | HR 99 | Temp 96.7°F | Resp 17

## 2022-10-02 DIAGNOSIS — J208 Acute bronchitis due to other specified organisms: Secondary | ICD-10-CM | POA: Insufficient documentation

## 2022-10-02 MED ORDER — PREDNISONE 20 MG PO TABS *I*
40.0000 mg | ORAL_TABLET | Freq: Every day | ORAL | 0 refills | Status: AC
Start: 2022-10-02 — End: 2022-10-07

## 2022-10-02 MED ORDER — BENZONATATE 100 MG PO CAPS *I*
100.0000 mg | ORAL_CAPSULE | Freq: Three times a day (TID) | ORAL | 0 refills | Status: AC | PRN
Start: 2022-10-02 — End: 2022-10-06

## 2022-10-02 NOTE — Patient Instructions (Signed)
Follow up with pcp and with pulmonology    If you develop chest pain, shortness of breath, fever, feel worse you need to go to the emergency department.    Use your inhalers as prescribed    Tylenol for comfort  Drink lots of fluids    Coricidin HB for cough and congestion  mucinex for cough     Cool mist vaporizer/ humidifier  vicks vapor rub

## 2022-10-02 NOTE — UC Provider Note (Signed)
History     Chief Complaint   Patient presents with    Cough     Cough for 3 days, slight ear pain on right side, denies fevers, cough is nonproductive, has not taken anything OTC     Pt is a 72 yo female with pmh of copd and saw pulmonology on 6/14 with no change to tx plan who started with right ear pain and cough x 3 days. No fever. States she normally needs prednisone when this happens.       History provided by:  Patient  Cough  Associated symptoms: ear pain    Associated symptoms: no fever        Medical/Surgical/Family History     Past Medical History:   Diagnosis Date    COPD (chronic obstructive pulmonary disease)     Hypothyroidism         There is no problem list on file for this patient.           No past surgical history on file.  No family history on file.       Social History     Tobacco Use    Smoking status: Never    Smokeless tobacco: Never   Substance Use Topics    Alcohol use: Not Currently    Drug use: Not Currently     Living Situation       Questions Responses    Patient lives with     Homeless     Caregiver for other family member     External Services     Employment     Domestic Violence Risk                   Review of Systems   Review of Systems   Constitutional:  Negative for fever.   HENT:  Positive for ear pain.    Respiratory:  Positive for cough.    All other systems reviewed and are negative.      Physical Exam   Vitals     First Recorded BP: 145/77, Resp: 17, Temp: 35.9 C (96.7 F), Temp src: TEMPORAL Oxygen Therapy SpO2: 96 %, Heart Rate: 99, (10/02/22 1726)  .      Physical Exam  Vitals and nursing note reviewed.   Constitutional:       General: She is not in acute distress.     Appearance: Normal appearance. She is well-developed. She is not ill-appearing.   HENT:      Right Ear: Tympanic membrane normal.      Left Ear: Tympanic membrane normal.      Nose: Nose normal.      Mouth/Throat:      Mouth: Mucous membranes are moist.      Pharynx: No oropharyngeal exudate or  posterior oropharyngeal erythema.   Cardiovascular:      Rate and Rhythm: Normal rate and regular rhythm.   Pulmonary:      Effort: Pulmonary effort is normal. No respiratory distress.      Breath sounds: Normal breath sounds. No wheezing, rhonchi or rales.      Comments: Has a deep cough noted  Musculoskeletal:      Cervical back: Normal range of motion and neck supple.   Lymphadenopathy:      Cervical: No cervical adenopathy.   Skin:     General: Skin is warm and dry.   Neurological:      Mental Status: She is alert and oriented to person,  place, and time.   Psychiatric:         Mood and Affect: Mood normal.         Behavior: Behavior normal.         Thought Content: Thought content normal.         Judgment: Judgment normal.          Medical Decision Making   Medical Decision Making    Patient seen by me: 10/02/2022     Assessment:  72 y.o., female comes to the Urgent Care for evaluation of right earache and cough x 3 days, no fever.    Differential diagnosis:   Otitis media  Uri  Bronchitis  Copd  pneumonia    Independent Review of: Chart / prior reports     Plan:   Orders Placed This Encounter    predniSONE (DELTASONE) 20 mg tablet    benzonatate (TESSALON) 100 mg capsule        Pt instructions:  Follow up with pcp and with pulmonology    If you develop chest pain, shortness of breath, fever, feel worse you need to go to the emergency department.    Use your inhalers as prescribed    Tylenol for comfort  Drink lots of fluids    Coricidin HB for cough and congestion  mucinex for cough     Cool mist vaporizer/ humidifier  vicks vapor rub     New Prescriptions    BENZONATATE (TESSALON) 100 MG CAPSULE    Take 1 capsule (100 mg total) by mouth 3 times daily as needed for Cough for up to 4 days.  Swallow whole. Do not crush or chew.    PREDNISONE (DELTASONE) 20 MG TABLET    Take 2 tablets (40 mg total) by mouth daily for 5 days.        Course & disposition: discharge to home      Final Diagnosis    ICD-10-CM ICD-9-CM    1. Viral bronchitis  J20.8 466.0         Harith Mccadden Blanchie Dessert, NP          Author:  Treasa School, NP

## 2022-10-06 ENCOUNTER — Emergency Department
Admission: EM | Admit: 2022-10-06 | Discharge: 2022-10-06 | Disposition: A | Discharge status: Home / Self Care | Payer: Medicare Other | Source: Ambulatory Visit | Attending: Emergency Medicine | Admitting: Emergency Medicine

## 2022-10-06 ENCOUNTER — Encounter: Payer: Self-pay | Admitting: Student in an Organized Health Care Education/Training Program

## 2022-10-06 ENCOUNTER — Emergency Department: Payer: Medicare Other

## 2022-10-06 DIAGNOSIS — Z1152 Encounter for screening for COVID-19: Secondary | ICD-10-CM | POA: Insufficient documentation

## 2022-10-06 DIAGNOSIS — R059 Cough, unspecified: Secondary | ICD-10-CM

## 2022-10-06 DIAGNOSIS — I517 Cardiomegaly: Secondary | ICD-10-CM

## 2022-10-06 DIAGNOSIS — J441 Chronic obstructive pulmonary disease with (acute) exacerbation: Secondary | ICD-10-CM | POA: Insufficient documentation

## 2022-10-06 DIAGNOSIS — I444 Left anterior fascicular block: Secondary | ICD-10-CM

## 2022-10-06 DIAGNOSIS — R000 Tachycardia, unspecified: Secondary | ICD-10-CM

## 2022-10-06 DIAGNOSIS — R0602 Shortness of breath: Secondary | ICD-10-CM

## 2022-10-06 LAB — COMPREHENSIVE METABOLIC PANEL
ALT: 22 U/L (ref 0–35)
AST: 28 U/L (ref 0–35)
Albumin: 3.9 g/dL (ref 3.5–5.2)
Alk Phos: 63 U/L (ref 35–105)
Anion Gap: 13 (ref 7–16)
Bilirubin,Total: 0.5 mg/dL (ref 0.0–1.2)
CO2: 28 mmol/L (ref 20–28)
Calcium: 8.9 mg/dL (ref 8.6–10.2)
Chloride: 100 mmol/L (ref 96–108)
Creatinine: 0.71 mg/dL (ref 0.51–0.95)
Glucose: 98 mg/dL (ref 60–99)
Lab: 13 mg/dL (ref 6–20)
Potassium: 3.5 mmol/L (ref 3.3–4.6)
Sodium: 141 mmol/L (ref 133–145)
Total Protein: 7.5 g/dL (ref 6.3–7.7)
eGFR BY CREAT: 92 *

## 2022-10-06 LAB — CBC AND DIFFERENTIAL
Baso # K/uL: 0 10*3/uL (ref 0.0–0.2)
Eos # K/uL: 0 10*3/uL (ref 0.0–0.5)
Hematocrit: 43 % (ref 34–49)
Hemoglobin: 14.3 g/dL (ref 11.2–16.0)
IMM Granulocytes #: 0 10*3/uL (ref 0.0–0.0)
IMM Granulocytes: 0.3 %
Lymph # K/uL: 1.6 10*3/uL (ref 1.0–5.0)
MCV: 89 fL (ref 75–100)
Mono # K/uL: 1.4 10*3/uL — ABNORMAL HIGH (ref 0.1–1.0)
Neut # K/uL: 7.7 10*3/uL — ABNORMAL HIGH (ref 1.5–6.5)
Nucl RBC # K/uL: 0 10*3/uL (ref 0.0–0.0)
Nucl RBC %: 0 /100 WBC (ref 0.0–0.2)
Platelets: 281 10*3/uL (ref 150–450)
RBC: 4.8 MIL/uL (ref 4.0–5.5)
RDW: 13.2 % (ref 0.0–15.0)
Seg Neut %: 71.6 %
WBC: 10.8 10*3/uL (ref 3.5–11.0)

## 2022-10-06 LAB — TROPONIN T 0 HR HIGH SENSITIVITY (IP/ED ONLY): TROP T 0 HR High Sensitivity: 7 ng/L (ref 0–11)

## 2022-10-06 LAB — LACTATE, PLASMA: Lactate: 1 mmol/L (ref 0.5–2.2)

## 2022-10-06 LAB — TROPONIN T 1 HR W/ DELTA HIGH SENSITIVITY: TROP T 1 HR High Sensitivity: <6 ng/L (ref 0–11)

## 2022-10-06 LAB — COVID-19 NAAT (PCR): COVID-19 NAAT (PCR): NEGATIVE

## 2022-10-06 LAB — PERFORMING LAB

## 2022-10-06 LAB — NT-PRO BNP: NT-pro BNP: 175 pg/mL (ref 0–900)

## 2022-10-06 MED ORDER — IPRATROPIUM-ALBUTEROL 0.5-2.5 MG/3ML IN SOLN *I*
3.0000 mL | Freq: Once | RESPIRATORY_TRACT | Status: AC
Start: 2022-10-06 — End: 2022-10-06
  Administered 2022-10-06: 3 mL via RESPIRATORY_TRACT
  Filled 2022-10-06: qty 3

## 2022-10-06 MED ORDER — DEXAMETHASONE SODIUM PHOSPHATE 10 MG/ML IJ SOLN *I*
10.0000 mg | Freq: Once | INTRAMUSCULAR | Status: AC
Start: 2022-10-06 — End: 2022-10-06
  Administered 2022-10-06: 10 mg via INTRAVENOUS
  Filled 2022-10-06: qty 1

## 2022-10-06 MED ORDER — AZITHROMYCIN 500 MG PO TABS *I*
500.0000 mg | ORAL_TABLET | Freq: Once | ORAL | Status: AC
Start: 2022-10-06 — End: 2022-10-06
  Administered 2022-10-06: 500 mg via ORAL
  Filled 2022-10-06: qty 1

## 2022-10-06 MED ORDER — METHYLPREDNISOLONE 4 MG PO TBPK *A*
ORAL_TABLET | ORAL | 0 refills | Status: DC
Start: 2022-10-06 — End: 2023-04-21

## 2022-10-06 MED ORDER — AZITHROMYCIN 250 MG PO TABS *I*
250.0000 mg | ORAL_TABLET | Freq: Every day | ORAL | 0 refills | Status: AC
Start: 2022-10-06 — End: 2022-10-10

## 2022-10-06 NOTE — Discharge Instructions (Signed)
Use your steroid inhaler on a daily basis.  Albuterol inhaler should be two puffs 4 x per day.

## 2022-10-06 NOTE — ED Triage Notes (Addendum)
SOB, cough for about a week, seen at [facility] yesterday and started on prednisone.  Symptoms getting worse.  After ambulating to triage room oxygen sat was 90% with increased work of breathing.  +cough.  +chills.  Denies chest pain.

## 2022-10-07 LAB — EKG 12-LEAD
P: 58 deg
PR: 150 ms
QRS: -79 deg
QRSD: 87 ms
QT: 333 ms
QTc: 442 ms
Rate: 105 {beats}/min
T: -85 deg

## 2022-10-08 NOTE — ED Provider Notes (Signed)
History     Chief Complaint   Patient presents with    Shortness of Breath     72 yo female patient with pmh of COPD and hypothyroidism wiho presents with cc of shortness of breath, chills, cough, oxygen saturation down to 90% after ambulation.  Was seen by an urgent care and given prednisone.  Did not have a CXR .  On reviewing her medications she has stopped the inhaled steroids and has only being using the albuterol occasionally.  Marland Kitchen  Has had a cough.  No abdominal pain.  No chest pain.  Has had a cough.  No sputum production.  No abdominal pain.        History provided by:  Patient  Language interpreter used: No          Medical/Surgical/Family History     Past Medical History:   Diagnosis Date    COPD (chronic obstructive pulmonary disease)     Hypothyroidism         There is no problem list on file for this patient.           History reviewed. No pertinent surgical history.       Social History     Tobacco Use    Smoking status: Never    Smokeless tobacco: Never   Substance Use Topics    Alcohol use: Not Currently    Drug use: Not Currently             Review of Systems   Constitutional:  Positive for chills. Negative for fever.   HENT:  Negative for rhinorrhea and sore throat.    Eyes:  Negative for visual disturbance.   Respiratory:  Positive for cough and shortness of breath.    Cardiovascular:  Negative for chest pain and palpitations.   Gastrointestinal:  Negative for abdominal pain, diarrhea, nausea and vomiting.   Genitourinary:  Negative for dysuria.   Skin:  Negative for rash.   Neurological:  Negative for speech difficulty, weakness and headaches.   Hematological:  Negative for adenopathy.   Psychiatric/Behavioral:  Negative for confusion.        Physical Exam     Triage Vitals  Triage Start: Start, (10/06/22 1422)  First Recorded BP: (!) 189/107, Resp: 20, Temp: 37.5 C (99.5 F), Temp src: Tympanic Oxygen Therapy SpO2: 96 %, Oximetry Source: Lt Hand, O2 Device: None (Room air), Heart Rate: (!)  113, (10/06/22 1410) Heart Rate (via Pulse Ox): (!) 113, (10/06/22 1410).      Physical Exam  Vitals and nursing note reviewed.   Constitutional:       Appearance: She is well-developed.   HENT:      Head: Normocephalic and atraumatic.      Mouth/Throat:      Pharynx: No pharyngeal swelling or oropharyngeal exudate.   Eyes:      Extraocular Movements: Extraocular movements intact.      Pupils: Pupils are equal, round, and reactive to light.   Cardiovascular:      Rate and Rhythm: Normal rate and regular rhythm.   Pulmonary:      Effort: Pulmonary effort is normal.      Breath sounds: Decreased breath sounds and wheezing present. No rhonchi or rales.   Chest:      Chest wall: No mass or edema.   Abdominal:      General: Bowel sounds are normal.      Palpations: Abdomen is soft. There is no mass.  Tenderness: There is no abdominal tenderness. There is no guarding or rebound.   Musculoskeletal:         General: Normal range of motion.      Right lower leg: No tenderness. No edema.      Left lower leg: No tenderness. No edema.   Skin:     General: Skin is warm.      Capillary Refill: Capillary refill takes less than 2 seconds.      Findings: No rash.   Neurological:      General: No focal deficit present.      Mental Status: She is alert and oriented to person, place, and time.   Psychiatric:         Mood and Affect: Mood normal.         Medical Decision Making   Patient seen by me on:  10/06/2022    Assessment:  72 yo female patient who presents with cc of shortness of breath, cough, and chills.     Differential diagnosis:  Pneumonia, copd exacerbation, bronchitis.     Plan:  Orders Placed This Encounter      Blood culture      Blood culture      *Chest STANDARD single view      CBC and differential      Comprehensive metabolic panel      Troponin T 0 HR High Sensitivity      Troponin T 1 HR W/ Delta High Sensitivity      Lactate, plasma      Lactate, plasma (CONDITIONAL)      NT-pro BNP      COVID-19 NAAT (PCR)       Performing Lab      EKG: initial      Insert peripheral IV        EKG Interpretation:  Sinus tachycardia, left anterior fascicular block,rate of 105    ED Course and Disposition:  The cxr shows no evidence of pneumonia.  The patient was given iv steroids and a nebulizer treatment.  .  She will be placed on antibiotics and steroids.  She is to restart her inhaled steroids and albuterol .      Diagnosis- copd exacerbation, bronchitis      ED Course as of 10/08/22 1404   Thu Oct 08, 2022   1322 O2 Device: None (Room air)   1358 NT-pro BNP   1358 Lactate, plasma   1358 Troponin T 0 HR High Sensitivity   1358 CBC and differential(!)   1402 Comprehensive metabolic panel   4782 Cxr-nad       Aadith Raudenbush Mardelle Matte, MD            Leeroy Bock, MD  10/08/22 1413

## 2022-10-12 LAB — BLOOD CULTURE
Bacterial Blood Culture: 0
Bacterial Blood Culture: 0

## 2023-02-02 ENCOUNTER — Encounter: Payer: Self-pay | Admitting: Gastroenterology

## 2023-04-21 ENCOUNTER — Other Ambulatory Visit: Payer: Self-pay

## 2023-04-21 ENCOUNTER — Ambulatory Visit: Payer: Medicare (Managed Care) | Attending: Family Medicine | Admitting: Family Medicine

## 2023-04-21 ENCOUNTER — Encounter: Payer: Self-pay | Admitting: Family Medicine

## 2023-04-21 VITALS — BP 122/80 | HR 78 | Temp 96.8°F | Resp 16 | Ht 60.0 in | Wt 212.8 lb

## 2023-04-21 DIAGNOSIS — E782 Mixed hyperlipidemia: Secondary | ICD-10-CM | POA: Insufficient documentation

## 2023-04-21 DIAGNOSIS — M170 Bilateral primary osteoarthritis of knee: Secondary | ICD-10-CM | POA: Insufficient documentation

## 2023-04-21 DIAGNOSIS — J449 Chronic obstructive pulmonary disease, unspecified: Secondary | ICD-10-CM | POA: Insufficient documentation

## 2023-04-21 DIAGNOSIS — E66813 Obesity, class 3: Secondary | ICD-10-CM | POA: Insufficient documentation

## 2023-04-21 DIAGNOSIS — I10 Essential (primary) hypertension: Secondary | ICD-10-CM | POA: Insufficient documentation

## 2023-04-21 DIAGNOSIS — E039 Hypothyroidism, unspecified: Secondary | ICD-10-CM | POA: Insufficient documentation

## 2023-04-21 DIAGNOSIS — Z6841 Body Mass Index (BMI) 40.0 and over, adult: Secondary | ICD-10-CM | POA: Insufficient documentation

## 2023-04-21 DIAGNOSIS — Z1389 Encounter for screening for other disorder: Secondary | ICD-10-CM | POA: Insufficient documentation

## 2023-04-21 MED ORDER — POTASSIUM CHLORIDE ER 20 MEQ PO TBCR
20.0000 meq | ORAL_TABLET | Freq: Every day | ORAL | 1 refills | Status: DC
Start: 2023-04-21 — End: 2023-10-05

## 2023-04-21 MED ORDER — ROSUVASTATIN CALCIUM 10 MG PO TABS *I*
10.0000 mg | ORAL_TABLET | Freq: Every day | ORAL | 1 refills | Status: DC
Start: 2023-04-21 — End: 2023-10-05

## 2023-04-21 MED ORDER — HYDROCHLOROTHIAZIDE 25 MG PO TABS *I*
25.0000 mg | ORAL_TABLET | Freq: Every day | ORAL | 1 refills | Status: DC
Start: 2023-04-21 — End: 2023-10-05

## 2023-04-21 NOTE — Progress Notes (Signed)
 [facility]     Subjective     Cathy Estrada is a 73 y.o. female who presents for New Patient Visit (Patient states bi-lateral leg swelling.)  History of Present Illness  Right Ear Pain  - Intermittent right ear pain for approximately one month  - No hearing issues or tinnitus  - No nasal congestion, allergies, or difficulty with mastication or deglutition    Urinary Issues  - No urinary issues but notes frequent urination    Hypothyroidism  - History of hypothyroidism  - Previously on levothyroxine for 1 to 2 years  - Medication not refilled during last visit to physician in [place]    COPD  - History of COPD  - Uses albuterol and Trelegy inhalers as needed    Hypertension  - History of hypertension  - Currently on hydrochlorothiazide 25 mg, lisinopril, and potassium chloride 20 mEq  - Takes furosemide as needed for leg swelling    Hyperlipidemia  - History of hyperlipidemia  - On rosuvastatin    Asthma  - History of asthma  - Uses albuterol and Trelegy inhalers as needed    Knee Pain  - Experiences mild knee pain attributed to bone-on-bone contact  - Declined surgical intervention  - Receives cortisone injections biannually  - Last injection received upon arrival in this area  - Uses ice packs for relief  - Takes Norco as needed, finds it effective  - Uses a rollator for mobility    Leg Swelling  - Experiencing leg swelling  - Uses hydrocodone-acetaminophen sparingly for leg pain    Nausea  - Uses Zofran as needed for nausea  - Cannot recall the last time it was required    Supplemental information: She is a non-smoker and does not consume alcohol or illicit drugs. She is currently unemployed and has a history of tubal ligation. She has two children, both delivered vaginally. She had a mammogram prior to her relocation.    SOCIAL HISTORY  She does not smoke, drink alcohol, or use illicit drugs.    ALLERGIES  She is allergic to CODEINE and PENICILLINS.         Objective    Blood pressure 122/80, pulse 78, temperature 36 C (96.8 F), temperature source Temporal, resp. rate 16, height 1.524 m (5'), weight 96.5 kg (212 lb 12.8 oz), SpO2 98%.  Physical Exam  General Appearance: Normal. Obese.  Vital signs: Within normal limits.  HEENT: Left ear canal and eardrum appear normal. Right ear canal appears normal with a little bit of fluid behind the eardrum, but it is not bulging or red. Left and right nostrils appear normal. Oral exam was performed.  Respiratory: Lungs are clear to auscultation.  Gastrointestinal: Abdomen has normal active bowel sounds.  Back, Musculoskeletal: Mild crepitus noted in the knees. Pain elicited with passive range of motion, extension of the right knee.  Extremities: moderate, non-pitting edema of lower extremities.  Skin: Warm and dry, no rash.  Neurological: Normal.  Other observations: No thyromegaly in the neck. Neck is supple.    Results  Laboratory Studies  Labs from July showed no anemia, normal liver function, kidney function, and heart function.          Assessment & Plan  1. Hypothyroidism.  She was previously on levothyroxine but did not get a refill from her doctor in [place]. Thyroid function will be assessed through blood work to determine if medication is needed.  2. Chronic Obstructive Pulmonary Disease (COPD).  Her oxygen saturation levels are within the normal range, indicating effective management of her COPD and asthma. She is advised to continue using Trelegy daily to prevent exacerbations and to use the albuterol inhaler as needed.    3. Hypertension.  Her blood pressure readings are within the normal range. She is currently taking hydrochlorothiazide 25 mg, lisinopril, and potassium chloride 20 mEq. She is advised to continue her current medication regimen.    4. Hyperlipidemia.  She is currently taking rosuvastatin for cholesterol management. A lipid panel will be included in the upcoming blood work to monitor her cholesterol  levels.    5. Knee OA.  She reports knee pain with bone-on-bone contact and occasional use of hydrocodone-acetaminophen for severe pain. She is advised to continue using ice packs and to take hydrocodone-acetaminophen only as needed to avoid dependency.    6. Obesity with BMI 40.0 to 44.9  Patient is counseled regarding obesity and associated morbidity/mortality.  Healthy eating practices reviewed.  Strategies to reduce overall caloric consumption are discussed.  Goals are to lose 1-2 pounds per week before next office visit.       7. Screening for substance abuse.  AUDIT and DAST reviewed with patient and there does not appear concern for alcohol or substance misuse.  Use of alcohol and drugs will be followed over time.   Follow-up  The patient will follow up in 3 months.                     Author: Oda Cogan., MD  Note signed: 04/21/2023

## 2023-04-22 ENCOUNTER — Telehealth: Payer: Self-pay | Admitting: Family Medicine

## 2023-04-22 NOTE — Telephone Encounter (Signed)
 Patient called asking for a referral to a Lymphedema specialist for her swollen hands and legs. She states that her legs were swollen yesterday during her appointemnt and the Dr. Malvin Johns. Please advise.

## 2023-04-23 NOTE — Addendum Note (Signed)
 Addended by: Lyla Glassing. on: 04/23/2023 07:53 AM     Modules accepted: Level of Service

## 2023-06-02 ENCOUNTER — Telehealth: Payer: Self-pay | Admitting: Family Medicine

## 2023-06-02 NOTE — Telephone Encounter (Signed)
 Husband called regarding referral for his wife - Cathy Estrada / her legs/ feet are swallon.. fluid in ears .Marland Kitchen Unable to move easily. She is not eating.Marland Kitchen

## 2023-06-02 NOTE — Telephone Encounter (Signed)
 Please call [phone] for information / can leave voice message

## 2023-06-02 NOTE — Telephone Encounter (Addendum)
 Call to husband, Sheralyn Boatman. He reports fluid in legs has been ongoing issues, prescribed fluid pills but she is not taking them regularly. He reports during week at daughter, Amber's home, she can't get to bathroom quick enough and can't stay out of the bathroom so she has not been taking. He reports he is not at home right now and he knows she is taking one of them at times but not sure which one. He states they need to get this taken care of and wants to know how to do this. Advised him if she is having BLE needs to be taking her diuretic to pull the fluid off. That is the first thing.     Daughter, Hospital doctor in background. Amber got on call. She states "we need to know what to do she can't live her life like this."  Advised her that she needs to be taking her diuretic. She states that she can't get to the bathroom in time at her house. Advised her that Sheralyn Boatman said patient can get to bathroom at her house. Daughter then states "She has to be at my house to watch my kids so I can work. She is at my house all week."  Stressed importance of taking her medication correctly. Asked how her breathing is, they are not with patient right now. Believe it is ok. Asked how far up legs swelling is, told almost to knees. Husband then asking about lymphedema therapy. Advised need to get the fluid pulled off first. Patient does not have phone with her. Daughter will call office back to get more information when she gets back to her home. Asked daughter to get weight on her as well. Will await call back.   Will need to determine breathing status and degree of swelling, pitting edema? Will then assess with provider whether restart meds or if needs ER visit at this point. Hold for return call.

## 2023-06-03 NOTE — Telephone Encounter (Addendum)
 No return call from patient or family yesterday.  Call to husband, Cathy Estrada.  Spoke with Cathy Estrada, she is taking some time off taking the kids and going to restart the diuretic.  Writer asked to talk to Marcus  Per patient weight was 204# yesterday on home scale. (Was 212# in office on 04/21/23)  She confirms that she has swelling almost to her knees both legs and is pitting edema.   She denies any chest pain. Says maybe  a little winded with  walking but not all the time. [place] needed" but does report since moving here fluid more persistent. Advised her importance of taking her meds daily as prescribed to lessen the load on her heart with the extra fluid she is retaining. She will resume the diuretics this morning. She has been taking the K+ right along. Advised her if trouble getting to bathroom maybe a commode could be helpful for her.   Will advise Dr Wardell Honour of above. Concern for CHF but has not been taking diuretics. Have her do the diuretics, daily weights and to ER if any chest pain shortness of breath? Or to ER today? Please advise.

## 2023-06-09 NOTE — Telephone Encounter (Signed)
 I spoke with patients husband Alinda Money, he states patient is doing much better she is taking her meds, the edema has went away and she is breathing better. Advised husband to notify office if any further issues or concerns.

## 2023-06-19 ENCOUNTER — Encounter: Payer: Self-pay | Admitting: Emergency Medicine

## 2023-06-19 ENCOUNTER — Ambulatory Visit: Payer: Medicare (Managed Care) | Attending: Emergency Medicine | Admitting: Emergency Medicine

## 2023-06-19 ENCOUNTER — Other Ambulatory Visit: Payer: Self-pay

## 2023-06-19 VITALS — BP 137/73 | HR 87 | Temp 97.5°F | Resp 17

## 2023-06-19 DIAGNOSIS — R059 Cough, unspecified: Secondary | ICD-10-CM | POA: Insufficient documentation

## 2023-06-19 DIAGNOSIS — B349 Viral infection, unspecified: Secondary | ICD-10-CM | POA: Insufficient documentation

## 2023-06-19 DIAGNOSIS — J069 Acute upper respiratory infection, unspecified: Secondary | ICD-10-CM | POA: Insufficient documentation

## 2023-06-19 MED ORDER — BENZONATATE 100 MG PO CAPS *I*
100.0000 mg | ORAL_CAPSULE | Freq: Three times a day (TID) | ORAL | 0 refills | Status: AC | PRN
Start: 2023-06-19 — End: 2023-06-24

## 2023-06-19 MED ORDER — PREDNISONE 20 MG PO TABS *I*
40.0000 mg | ORAL_TABLET | Freq: Every day | ORAL | 0 refills | Status: AC
Start: 2023-06-19 — End: 2023-06-24

## 2023-06-19 NOTE — Patient Instructions (Signed)
 Plenty of fluids - tylenol for pain and fever - over the counter medication for congestion - coricidin HBP  - prednisone and tessalon as prescribed - continue with inhaler as needed - if worsening symptoms go to emergency - follow up with your doctor as needed

## 2023-06-19 NOTE — UC Provider Note (Signed)
 History     Chief Complaint   Patient presents with    Cough     Sx started 3 days ago & have become progressively worse - cough, nasal congestion, ear pain (right worse than left - 2/10), fatigue.  Denies fever.  Robitussin & Tylenol PRN.     Pt comes to [facility] for 3 days of uri symptoms - hx of copd - uses an inhaler - some ear pain and congestion - cough         Medical/Surgical/Family History     Past Medical History:   Diagnosis Date    Asthma     COPD (chronic obstructive pulmonary disease)     Hypercholesteremia     Hypertension     Hypothyroidism         There is no problem list on file for this patient.           Past Surgical History:   Procedure Laterality Date    TUBAL LIGATION Bilateral      Family History   Problem Relation Age of Onset    Hypertension Mother     Thyroid disease Mother     Gout Mother     Lung cancer Mother     High cholesterol Mother     Urolithiasis Father     Alcohol use disorder Father     Colon cancer Father           Social History[1]  Living Situation       Questions Responses    Patient lives with     Homeless     Caregiver for other family member     External Services     Employment     Domestic Violence Risk                   Review of Systems   Review of Systems   Constitutional:  Negative for chills and fever.   HENT:  Positive for congestion and ear pain. Negative for sinus pressure, sinus pain and sore throat.    Respiratory:  Positive for cough. Negative for shortness of breath, wheezing and stridor.    Cardiovascular:  Negative for chest pain.   Neurological:  Positive for weakness (mild generalized). Negative for dizziness.       Physical Exam   Vitals     First Recorded BP: 137/73, Resp: 17, Temp: 36.4 C (97.5 F), Temp src: TEMPORAL Oxygen Therapy SpO2: 98 %, Heart Rate: 87, (06/19/23 0913)  .      Physical Exam  Vitals and nursing note reviewed.   Constitutional:       General: She is not in acute distress.     Appearance: Normal appearance. She is ill-appearing  (slight - cough). She is not toxic-appearing.   HENT:      Head: Normocephalic.      Right Ear: Tympanic membrane and ear canal normal.      Left Ear: Tympanic membrane and ear canal normal.      Nose: Congestion (slight) present.      Mouth/Throat:      Mouth: Mucous membranes are moist.      Pharynx: Posterior oropharyngeal erythema (slight on edges) present. No oropharyngeal exudate.   Eyes:      Extraocular Movements: Extraocular movements intact.      Conjunctiva/sclera: Conjunctivae normal.      Pupils: Pupils are equal, round, and reactive to light.   Cardiovascular:      Rate  and Rhythm: Normal rate.   Pulmonary:      Effort: Pulmonary effort is normal. No respiratory distress.      Breath sounds: No wheezing, rhonchi or rales.   Musculoskeletal:      Cervical back: Normal range of motion.   Skin:     General: Skin is warm and dry.   Neurological:      Mental Status: She is alert.          Medical Decision Making   Medical Decision Making  Assessment:    Pt comes to [facility] for uri - cough and mild congestion - suspect uri - viral illness     Differential diagnosis:    Viral illness- sinusitis  - bronchitis- ear infection - serous otitis     Plan and Results:    Home for symptomatic tx - short course of prednisone and tessalon - follow up as needed         Diagnosis and Disposition:   [place]- viral illness - cough - home     Return precautions discussed and provided on AVS.    Discharge Medications  Start Taking             predniSONE (DELTASONE) 20 mg tablet Take 2 tablets (40 mg total) by mouth   daily for 5 days.    benzonatate (TESSALON) 100 mg capsule Take 1 capsule (100 mg total) by   mouth 3 times daily as needed for Cough for up to 5 days for Cough.    Swallow whole. Do not crush or chew.            Follow-up  No follow-ups on file.            Final Diagnosis    ICD-10-CM ICD-9-CM   1. Upper respiratory tract infection, unspecified type  J06.9 465.9   2. Viral illness  B34.9 079.99         Brynlynn Walko D Kurtiss Wence,  MD                  [1]   Social History  Tobacco Use    Smoking status: Never     Passive exposure: Never    Smokeless tobacco: Never   Substance Use Topics    Alcohol use: Not Currently    Drug use: Not Currently

## 2023-08-13 ENCOUNTER — Telehealth: Payer: Self-pay | Admitting: Family Medicine

## 2023-08-13 NOTE — Telephone Encounter (Signed)
 Called and spoke with patient regarding pain

## 2023-08-13 NOTE — Telephone Encounter (Signed)
 Called and spoke with patient she is ok waiting until appt on the 22

## 2023-08-13 NOTE — Telephone Encounter (Signed)
 Pt was scheduled by secretary Tuesday for back pain radiating into arms. Does this need to be triaged? Seen sooner?     Please advise

## 2023-08-17 ENCOUNTER — Other Ambulatory Visit: Payer: Self-pay

## 2023-08-17 ENCOUNTER — Encounter: Payer: Self-pay | Admitting: Family Medicine

## 2023-08-17 ENCOUNTER — Ambulatory Visit: Payer: Medicare (Managed Care) | Attending: Family Medicine | Admitting: Family Medicine

## 2023-08-17 ENCOUNTER — Ambulatory Visit
Admission: RE | Admit: 2023-08-17 | Discharge: 2023-08-17 | Disposition: A | Discharge status: Home / Self Care | Payer: Medicare (Managed Care) | Source: Ambulatory Visit | Admitting: Radiology

## 2023-08-17 VITALS — BP 132/82 | HR 87 | Resp 22 | Ht 60.0 in | Wt 199.0 lb

## 2023-08-17 DIAGNOSIS — M25612 Stiffness of left shoulder, not elsewhere classified: Secondary | ICD-10-CM | POA: Insufficient documentation

## 2023-08-17 DIAGNOSIS — M25512 Pain in left shoulder: Secondary | ICD-10-CM | POA: Insufficient documentation

## 2023-08-17 DIAGNOSIS — M47814 Spondylosis without myelopathy or radiculopathy, thoracic region: Secondary | ICD-10-CM

## 2023-08-17 DIAGNOSIS — M546 Pain in thoracic spine: Secondary | ICD-10-CM

## 2023-08-17 NOTE — Patient Instructions (Addendum)
 Rest- try not to exacerbate the pain    Ice/heat 20 minutes on and off. Alternating if helpful     Tylenol and motrin- alternating them as needed for pain    Over the counter analgesic creams as needed.    Call with increased pain, shortness of breath, chest pain, loss of bowel or bladder control

## 2023-08-17 NOTE — Progress Notes (Signed)
 [facility]    Verbal consent was obtained by the patient or patient's caregiver/guardian to utilize real-time recording in the office visit room to capture for visit note writing technology.      Subjective     Cathy Estrada is a 73 y.o. female who presents for Back Pain (Patient here today for back pain that moves down their arms bilaterally.)  History of Present Illness  The patient presents for evaluation of right upper back pain and left shoulder pain.    Right Upper Back Pain  - Mild pain in the right upper back began approximately 3 to 4 weeks ago  - Pain is not as severe today as it typically is  - Sensation akin to discomfort following an injection in the right arm started about a week prior to visit  - Engaging in water-based exercises, including leg and arm movements, but currently unable to swim  - No history of injuries to shoulders or back  - No numbness or tingling  - Managing pain with Tylenol on a few occasions    Left Shoulder Pain  - Severe pain in the left arm occasionally prevents turning over during sleep  - Rates shoulder pain as 10 out of 10 when attempting to turn over or lie on it  - Rates shoulder pain as 9 out of 10 during regular flare-ups  - Managing pain with Tylenol on a few occasions  - Denies N/T of ext.    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  Physical Exam  General Appearance: Normal.  Vital signs: Within normal limits.  Back, Musculoskeletal: Tenderness noted in the right upper back and left shoulder. Left arm Pain elicited with lateral arm elevation. Decreased ROM. No numbness or tingling reported. Bilateral strength equal. +radial and brachial pulses.   Skin: Warm and dry, no rash.  Neurological: Normal.    Results            Assessment & Plan  1. Right upper back  pain: acute.  - The pain may be due to underlying arthritis exacerbated by recent physical activities.  - An x-ray of the upper back and neck will be ordered to investigate potential causes of radiating pain to the right side of the back and down the arm.   - Rest the area and avoid activities that worsen the pain.  - Apply ice or heat for 20 minutes at a time, alternating as needed.  - Use over-the-counter analgesics such as Tylenol or Motrin for pain management.  - Consider physical therapy if x-ray results do not indicate any structural bone issues.  - Denies N/T or right upper ext. N changes in ROM     2. Left shoulder pain: acute.  - The pain may be due to underlying arthritis exacerbated by recent physical activities.  - An x-ray of the left shoulder will be ordered to check for underlying issues.  - Rest the area and avoid activities that worsen the pain.  - Apply ice or heat for 20 minutes at a time, alternating as needed.  - Use over-the-counter analgesics such as Tylenol or Motrin for pain management.  - Topical treatments like Voltaren cream or IcyHot may also be beneficial.  - Referral to orthopedic specialist  Dr. Braxton Calico for further evaluation.  - Consider physical therapy if x-ray results do not indicate any structural bone issues.    Follow-up  - Follow-up as needed based on x-ray results.                 Author: Rosaria Common, NP  Note signed: 08/17/2023

## 2023-08-18 NOTE — Telephone Encounter (Signed)
 Patient did have appt she must have changed it so she could see Starlette Ebbs yesterday

## 2023-08-24 ENCOUNTER — Telehealth: Payer: Self-pay | Admitting: Family Medicine

## 2023-08-24 NOTE — Telephone Encounter (Signed)
 Husband stopped in and ask if he could get a call results on the xrays pt had done last week.

## 2023-08-25 NOTE — Telephone Encounter (Signed)
 Husband called back. Please try again, states they are available today

## 2023-08-25 NOTE — Telephone Encounter (Signed)
 Writer spoke to SO and discussed results of X-ray. He states she is feeling better today. Requesting lab work. Labs previously ordered by PCP. They will try to get them drawn tomorrow

## 2023-08-28 ENCOUNTER — Other Ambulatory Visit
Admission: RE | Admit: 2023-08-28 | Discharge: 2023-08-28 | Disposition: A | Discharge status: Home / Self Care | Payer: Medicare (Managed Care) | Source: Ambulatory Visit | Attending: Family Medicine | Admitting: Family Medicine

## 2023-08-28 DIAGNOSIS — E039 Hypothyroidism, unspecified: Secondary | ICD-10-CM | POA: Insufficient documentation

## 2023-08-28 DIAGNOSIS — E782 Mixed hyperlipidemia: Secondary | ICD-10-CM | POA: Insufficient documentation

## 2023-08-28 DIAGNOSIS — E66813 Obesity, class 3: Secondary | ICD-10-CM | POA: Insufficient documentation

## 2023-08-28 DIAGNOSIS — Z6841 Body Mass Index (BMI) 40.0 and over, adult: Secondary | ICD-10-CM | POA: Insufficient documentation

## 2023-08-28 DIAGNOSIS — I10 Essential (primary) hypertension: Secondary | ICD-10-CM | POA: Insufficient documentation

## 2023-08-28 DIAGNOSIS — Z79899 Other long term (current) drug therapy: Secondary | ICD-10-CM | POA: Insufficient documentation

## 2023-08-28 LAB — LIPID PANEL
Chol/HDL Ratio: 2.4
Cholesterol: 125 mg/dL
HDL: 53 mg/dL (ref 40–60)
LDL Calculated: 58 mg/dL
Non HDL Cholesterol: 72 mg/dL
Triglycerides: 68 mg/dL

## 2023-08-28 LAB — COMPREHENSIVE METABOLIC PANEL
ALT: 10 U/L (ref 0–35)
AST: 16 U/L (ref 0–35)
Albumin: 4 g/dL (ref 3.5–5.2)
Alk Phos: 62 U/L (ref 35–105)
Anion Gap: 12 (ref 7–16)
Bilirubin,Total: 0.4 mg/dL (ref 0.0–1.2)
CO2: 25 mmol/L (ref 20–28)
Calcium: 9.1 mg/dL (ref 8.6–10.2)
Chloride: 107 mmol/L (ref 96–108)
Creatinine: 0.63 mg/dL (ref 0.51–0.95)
Glucose: 102 mg/dL — ABNORMAL HIGH (ref 60–99)
Lab: 16 mg/dL (ref 6–20)
Potassium: 4 mmol/L (ref 3.3–4.6)
Sodium: 144 mmol/L (ref 133–145)
Total Protein: 6.9 g/dL (ref 6.3–7.7)
eGFR BY CREAT: 95 *

## 2023-08-28 LAB — CBC
Hematocrit: 44 % (ref 34–49)
Hemoglobin: 14.3 g/dL (ref 11.2–16.0)
MCV: 91 fL (ref 75–100)
Platelets: 284 10*3/uL (ref 150–450)
RBC: 4.8 MIL/uL (ref 4.0–5.5)
RDW: 13.2 % (ref 0.0–15.0)
WBC: 7.1 10*3/uL (ref 3.5–11.0)

## 2023-08-28 LAB — T4, FREE: Free T4: 1.1 ng/dL (ref 0.9–1.7)

## 2023-08-28 LAB — TSH WITH REFLEX TO FT4: TSH: 4.5 u[IU]/mL — ABNORMAL HIGH (ref 0.27–4.20)

## 2023-08-29 LAB — HEMOGLOBIN A1C: Hemoglobin A1C: 5.8 % — ABNORMAL HIGH

## 2023-09-19 ENCOUNTER — Encounter: Payer: Self-pay | Admitting: Family Medicine

## 2023-09-24 ENCOUNTER — Ambulatory Visit: Payer: Self-pay | Admitting: Family Medicine

## 2023-09-24 DIAGNOSIS — E039 Hypothyroidism, unspecified: Secondary | ICD-10-CM

## 2023-10-04 ENCOUNTER — Other Ambulatory Visit
Admission: RE | Admit: 2023-10-04 | Discharge: 2023-10-04 | Disposition: A | Discharge status: Home / Self Care | Payer: Medicare (Managed Care) | Source: Ambulatory Visit | Attending: Family Medicine | Admitting: Family Medicine

## 2023-10-04 DIAGNOSIS — E039 Hypothyroidism, unspecified: Secondary | ICD-10-CM | POA: Insufficient documentation

## 2023-10-04 LAB — T4, FREE: Free T4: 1 ng/dL (ref 0.9–1.7)

## 2023-10-04 LAB — TSH WITH REFLEX TO FT4: TSH: 4.45 u[IU]/mL — ABNORMAL HIGH (ref 0.27–4.20)

## 2023-10-05 ENCOUNTER — Other Ambulatory Visit: Payer: Self-pay | Admitting: Family Medicine

## 2023-11-03 ENCOUNTER — Other Ambulatory Visit: Payer: Self-pay

## 2023-11-03 ENCOUNTER — Ambulatory Visit: Payer: Medicare (Managed Care) | Attending: Family Medicine | Admitting: Family Medicine

## 2023-11-03 ENCOUNTER — Encounter: Payer: Self-pay | Admitting: Family Medicine

## 2023-11-03 VITALS — BP 116/86 | HR 87 | Resp 22 | Wt 208.0 lb

## 2023-11-03 DIAGNOSIS — M25512 Pain in left shoulder: Secondary | ICD-10-CM | POA: Insufficient documentation

## 2023-11-03 DIAGNOSIS — Z13820 Encounter for screening for osteoporosis: Secondary | ICD-10-CM | POA: Insufficient documentation

## 2023-11-03 DIAGNOSIS — R21 Rash and other nonspecific skin eruption: Secondary | ICD-10-CM | POA: Insufficient documentation

## 2023-11-03 DIAGNOSIS — Z1231 Encounter for screening mammogram for malignant neoplasm of breast: Secondary | ICD-10-CM | POA: Insufficient documentation

## 2023-11-03 MED ORDER — METHYLPREDNISOLONE 4 MG PO TBPK *A*
ORAL_TABLET | ORAL | 0 refills | Status: AC
Start: 2023-11-03 — End: ?

## 2023-11-03 NOTE — Patient Instructions (Addendum)
 [facility]: [phone]. Call todayZyrtec- can make you drowsyCall Monday if itching and bumps do not improve or worsen

## 2023-11-03 NOTE — Progress Notes (Signed)
 [name] Subjective Cathy Estrada is a 73 y.o. female who presents for Arm Pain (Aches and pain in left arm/not able to lift arm up or outward) and Other (Breaking out with spots all over her body/she is scratching them off)History of Present IllnessThe patient presents for evaluation of left arm pain and skin rash.Left Arm Pain- She has been experiencing pain in her left arm for approximately 1.5 months.- The pain is not constant but can reach a severity of 10 out of 10, particularly when she attempts to lift her arm or after periods of immobility, such as during sleep.- She also reports discomfort when lying on the affected arm.- The pain is not present at rest, although she did experience a throbbing sensation yesterday.- She took 2 Tylenol tablets yesterday, which significantly alleviated the pain.- She reports no numbness or tingling in the arm.- She has not received any communication from the orthopedic department regarding her referral.- She has previously consulted with Dr. Fernande. But has not seen him recenlty- She has not had any recent falls or injuries that could have caused the pain.Skin Rash- She has noticed a rash developing on her legs, arms and abd.- She reports no changes in her use of detergents, lotions, or perfumes.- She is not currently taking any allergy medications.She is due for a mammogram, having not had one in the past 2 years.She has never had any issues with the findings on previous mammograms.She has not had a DEXA scan recently. Objective Blood pressure 116/86, pulse 87, resp. rate 22, weight 94.3 kg (208 lb), SpO2 99%.Physical ExamGeneral Appearance: Normal.Vital signs: Within normal limits.HEENT: Within normal limits.Respiratory: Within normal limits.Back, Musculoskeletal: Limited range of motion in the left shoulder, unable to raise arm without pain and assistance by the  other arm. Extremities: Normal.Skin: Rash noted on the legs and arms, small random scattered red spots that itch. No specific pattern. Neurological: Normal.Results- Imaging:  - X-ray of the spine: Routine aging changes and some bone spurs, but nothing acuteAssessment & PlanLeft shoulder painThe patient reports severe left shoulder pain, rated 9-10 out of 10, which is exacerbated by movement and certain positions.Diagnostic plan: An x-ray of the left shoulder has been ordered to assist in further evaluation. A referral to [facility] has been made, and the patient has been provided with their contact information to schedule an appointment.Treatment plan: Additionally, a referral for physical therapy has been initiated to manage the shoulder pain. The patient is advised to continue using Tylenol as needed for pain relief.Skin rashThe patient presents with a skin rash on her legs and other areas, with no known exposure to new detergents, lotions, or perfumes.Treatment plan: A short course of prednisone  for 6 days has been prescribed to alleviate the rash. Over-the-counter Zyrtec has been recommended to manage itching, to be taken at night due to its potential sedative effects. If the rash does not improve or worsens by Monday, the patient is advised to contact the office for further management.Health maintenanceThe patient is due for a mammogram, having not had one in about 2 years.Diagnostic plan: An order for a mammogram has been placed. Additionally, a DEXA scan has been ordered to assess bone health, as the last scan was conducted in 09/2022.Treatment plan: The patient is advised to schedule the mammogram and DEXA scan appointments as instructed.Follow-up: The patient will follow up as needed. Author: Corean FORBES Deaner, NP  Note signed: 11/03/2023

## 2023-12-03 ENCOUNTER — Telehealth: Payer: Self-pay | Admitting: Family Medicine

## 2023-12-03 NOTE — Telephone Encounter (Signed)
 Blank TemplatePatient called asking about getting a script for Covid vaccination. In formed patient that she needs to see if she qualifies for the vaccine and if he does please find out which one patient is going to call back next week with needed information.

## 2023-12-07 ENCOUNTER — Other Ambulatory Visit: Payer: Self-pay

## 2023-12-07 NOTE — Addendum Note (Signed)
 Addended by: JENE COREAN BRAVO. on: 12/07/2023 03:01 PM Modules accepted: Orders

## 2023-12-22 ENCOUNTER — Other Ambulatory Visit: Payer: Self-pay | Admitting: Family Medicine

## 2023-12-22 ENCOUNTER — Other Ambulatory Visit: Payer: Self-pay

## 2023-12-22 DIAGNOSIS — Z1231 Encounter for screening mammogram for malignant neoplasm of breast: Secondary | ICD-10-CM

## 2023-12-23 ENCOUNTER — Ambulatory Visit
Admission: RE | Admit: 2023-12-23 | Discharge: 2023-12-23 | Disposition: A | Discharge status: Home / Self Care | Payer: Medicare (Managed Care) | Source: Ambulatory Visit

## 2023-12-23 ENCOUNTER — Ambulatory Visit
Admission: RE | Admit: 2023-12-23 | Discharge: 2023-12-23 | Disposition: A | Discharge status: Home / Self Care | Payer: Medicare (Managed Care) | Source: Ambulatory Visit | Attending: Family Medicine | Admitting: Family Medicine

## 2023-12-23 DIAGNOSIS — M8589 Other specified disorders of bone density and structure, multiple sites: Secondary | ICD-10-CM

## 2023-12-23 DIAGNOSIS — N6320 Unspecified lump in the left breast, unspecified quadrant: Secondary | ICD-10-CM | POA: Insufficient documentation

## 2023-12-23 DIAGNOSIS — N6310 Unspecified lump in the right breast, unspecified quadrant: Secondary | ICD-10-CM | POA: Insufficient documentation

## 2023-12-23 DIAGNOSIS — Z13820 Encounter for screening for osteoporosis: Secondary | ICD-10-CM

## 2023-12-23 DIAGNOSIS — Z803 Family history of malignant neoplasm of breast: Secondary | ICD-10-CM | POA: Insufficient documentation

## 2023-12-23 DIAGNOSIS — Z1231 Encounter for screening mammogram for malignant neoplasm of breast: Secondary | ICD-10-CM | POA: Insufficient documentation

## 2023-12-23 DIAGNOSIS — M8588 Other specified disorders of bone density and structure, other site: Secondary | ICD-10-CM | POA: Insufficient documentation

## 2023-12-30 ENCOUNTER — Encounter: Payer: Self-pay | Admitting: Family Medicine

## 2023-12-30 DIAGNOSIS — M8580 Other specified disorders of bone density and structure, unspecified site: Secondary | ICD-10-CM | POA: Insufficient documentation

## 8443-09-28 DEATH — deceased
